# Patient Record
Sex: Male | Born: 1940 | Race: White | Hispanic: No | Marital: Married | Smoking: Former smoker
Health system: Southern US, Community
[De-identification: ages and names within clinical notes are randomized; demographics above are authoritative.]

## PROBLEM LIST (undated history)

## (undated) DIAGNOSIS — I2510 Atherosclerotic heart disease of native coronary artery without angina pectoris: Secondary | ICD-10-CM

## (undated) DIAGNOSIS — I10 Essential (primary) hypertension: Secondary | ICD-10-CM

## (undated) DIAGNOSIS — I119 Hypertensive heart disease without heart failure: Secondary | ICD-10-CM

## (undated) DIAGNOSIS — E785 Hyperlipidemia, unspecified: Secondary | ICD-10-CM

## (undated) HISTORY — DX: Atherosclerotic heart disease of native coronary artery without angina pectoris: I25.10

## (undated) HISTORY — DX: Essential (primary) hypertension: I10

## (undated) HISTORY — DX: Hypertensive heart disease without heart failure: I11.9

## (undated) HISTORY — PX: INGUINAL HERNIA REPAIR: SUR1180

## (undated) HISTORY — PX: SKIN CANCER EXCISION: SHX779

## (undated) HISTORY — DX: Hyperlipidemia, unspecified: E78.5

## (undated) HISTORY — PX: CORONARY ARTERY BYPASS GRAFT: SHX141

## (undated) HISTORY — PX: HYDROCELE EXCISION: SHX482

## (undated) HISTORY — PX: TOTAL SHOULDER REPLACEMENT: SUR1217

## (undated) HISTORY — PX: KNEE SURGERY: SHX244

---

## 2004-01-29 ENCOUNTER — Ambulatory Visit: Payer: Self-pay | Admitting: Family Medicine

## 2004-07-21 ENCOUNTER — Ambulatory Visit: Payer: Self-pay | Admitting: Family Medicine

## 2004-12-05 ENCOUNTER — Ambulatory Visit: Payer: Self-pay | Admitting: Family Medicine

## 2004-12-19 ENCOUNTER — Ambulatory Visit (HOSPITAL_COMMUNITY): Admission: RE | Admit: 2004-12-19 | Discharge: 2004-12-19 | Payer: Self-pay | Admitting: Cardiology

## 2004-12-19 ENCOUNTER — Ambulatory Visit: Payer: Self-pay | Admitting: Cardiology

## 2004-12-26 ENCOUNTER — Ambulatory Visit: Payer: Self-pay | Admitting: Family Medicine

## 2004-12-27 ENCOUNTER — Emergency Department (HOSPITAL_COMMUNITY): Admission: EM | Admit: 2004-12-27 | Discharge: 2004-12-27 | Payer: Self-pay | Admitting: Emergency Medicine

## 2004-12-30 ENCOUNTER — Inpatient Hospital Stay (HOSPITAL_COMMUNITY): Admission: RE | Admit: 2004-12-30 | Discharge: 2005-01-04 | Payer: Self-pay | Admitting: Cardiothoracic Surgery

## 2005-01-14 ENCOUNTER — Emergency Department (HOSPITAL_COMMUNITY): Admission: EM | Admit: 2005-01-14 | Discharge: 2005-01-14 | Payer: Self-pay | Admitting: Emergency Medicine

## 2005-02-03 ENCOUNTER — Ambulatory Visit: Payer: Self-pay | Admitting: Family Medicine

## 2006-11-03 IMAGING — CR DG CHEST 1V PORT
1 series · 1 of 1 positions shown · non-contrast
Comparison: none

CLINICAL DATA: 64 year old male; coronary artery disease, status post CABG.
PORTABLE AP CHEST - 1 VIEW - 12/30/04:

[view not recorded]
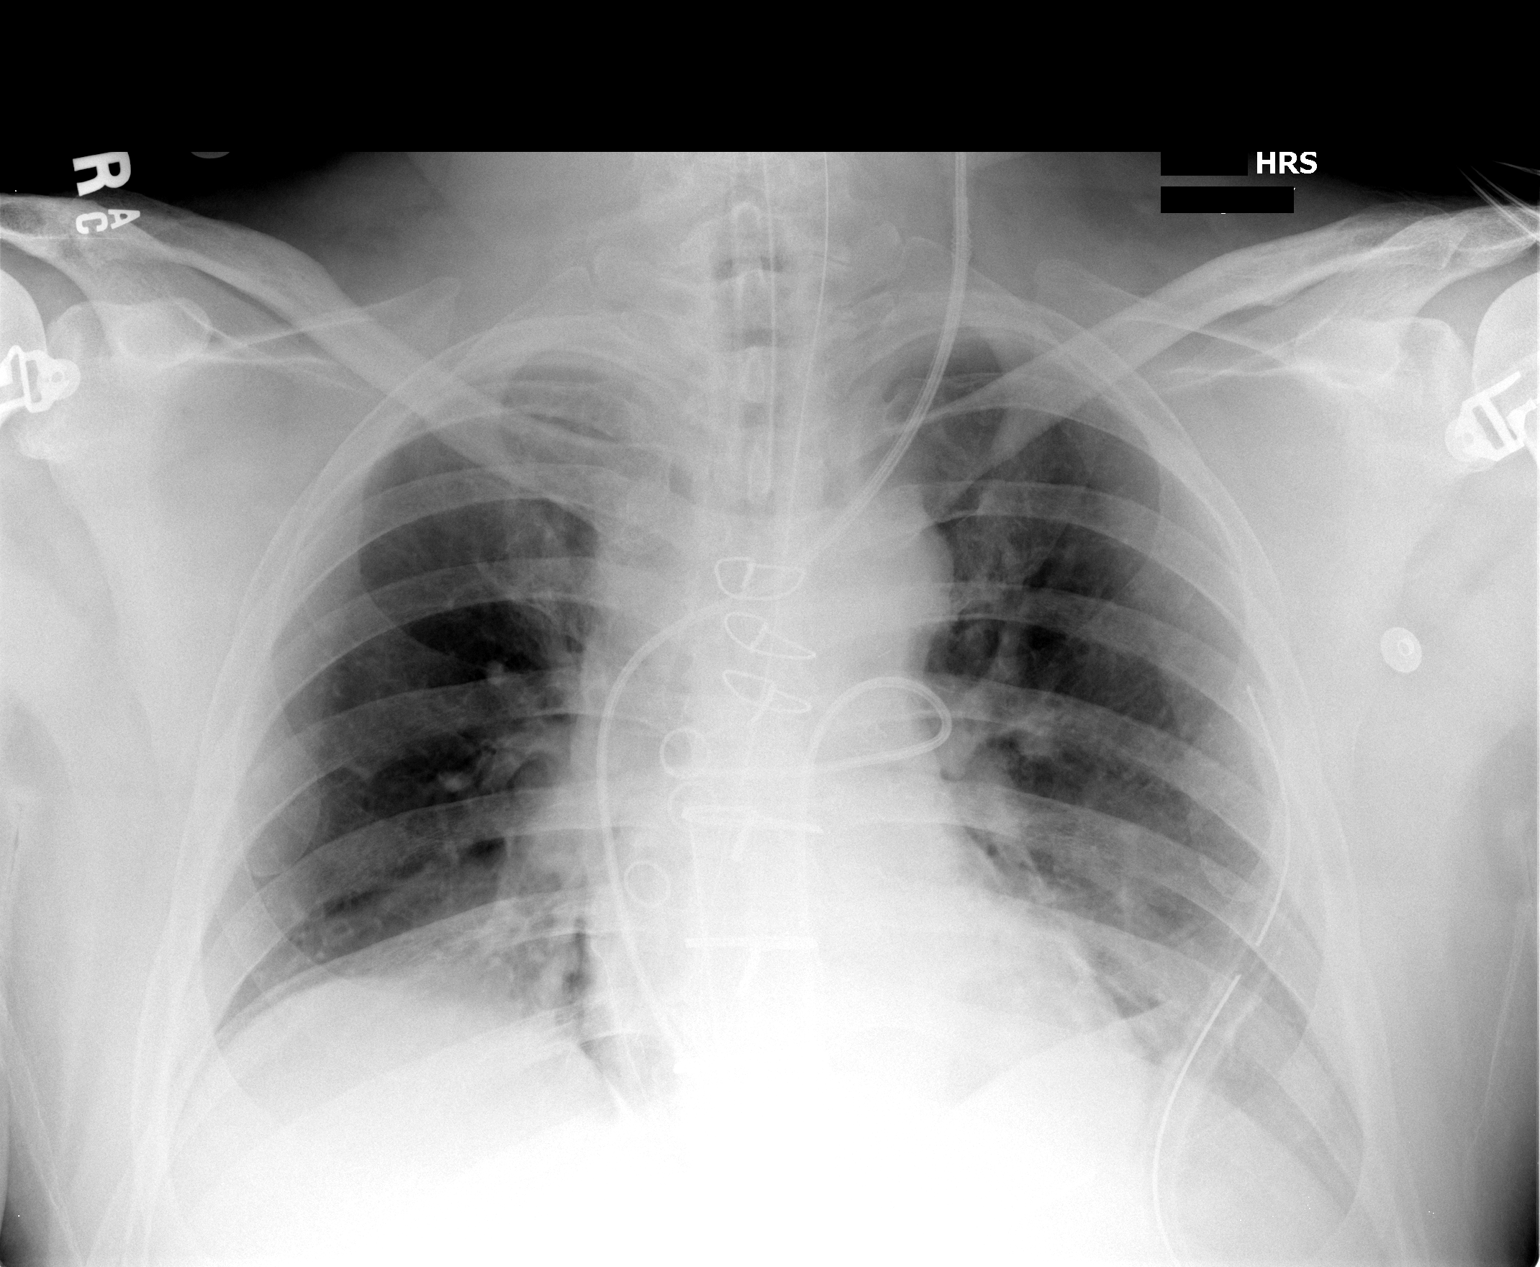

[1 of 1 positions shown; findings below may reference images not displayed]

FINDINGS: The endotracheal tube terminates at the level of the clavicles, well above the carina.  The patient is status post median sternotomy for CABG.  Three venous graft markers are in place.  A nasogastric tube courses of the inferior border of the film.  A Swan-Ganz catheter enters via the left IJ sheath with the tip in the proximal right pulmonary artery.  There is a loop in catheter within pulmonary outflow tract potentially extending into the left pulmonary artery.
Mediastinal drains and a left-sided chest tube are in place.  There is no pneumothorax.  Lung volumes are low with bibasilar atelectasis.  A small left effusion is noted.
IMPRESSION: 1.  Status post CABG.
2.  Support apparatus as above.  There is a loop in the Swan-Ganz catheter within the pulmonary outflow tract which is directed into the left pulmonary artery.  The tip is directed towards the right pulmonary artery.
3.  Small left pleural effusion and bibasilar atelectasis.

## 2006-11-04 IMAGING — CR DG CHEST 1V PORT
1 series · 1 of 1 positions shown · non-contrast
Comparison: 12/30/04.

CLINICAL DATA: CABG.

[view not recorded]
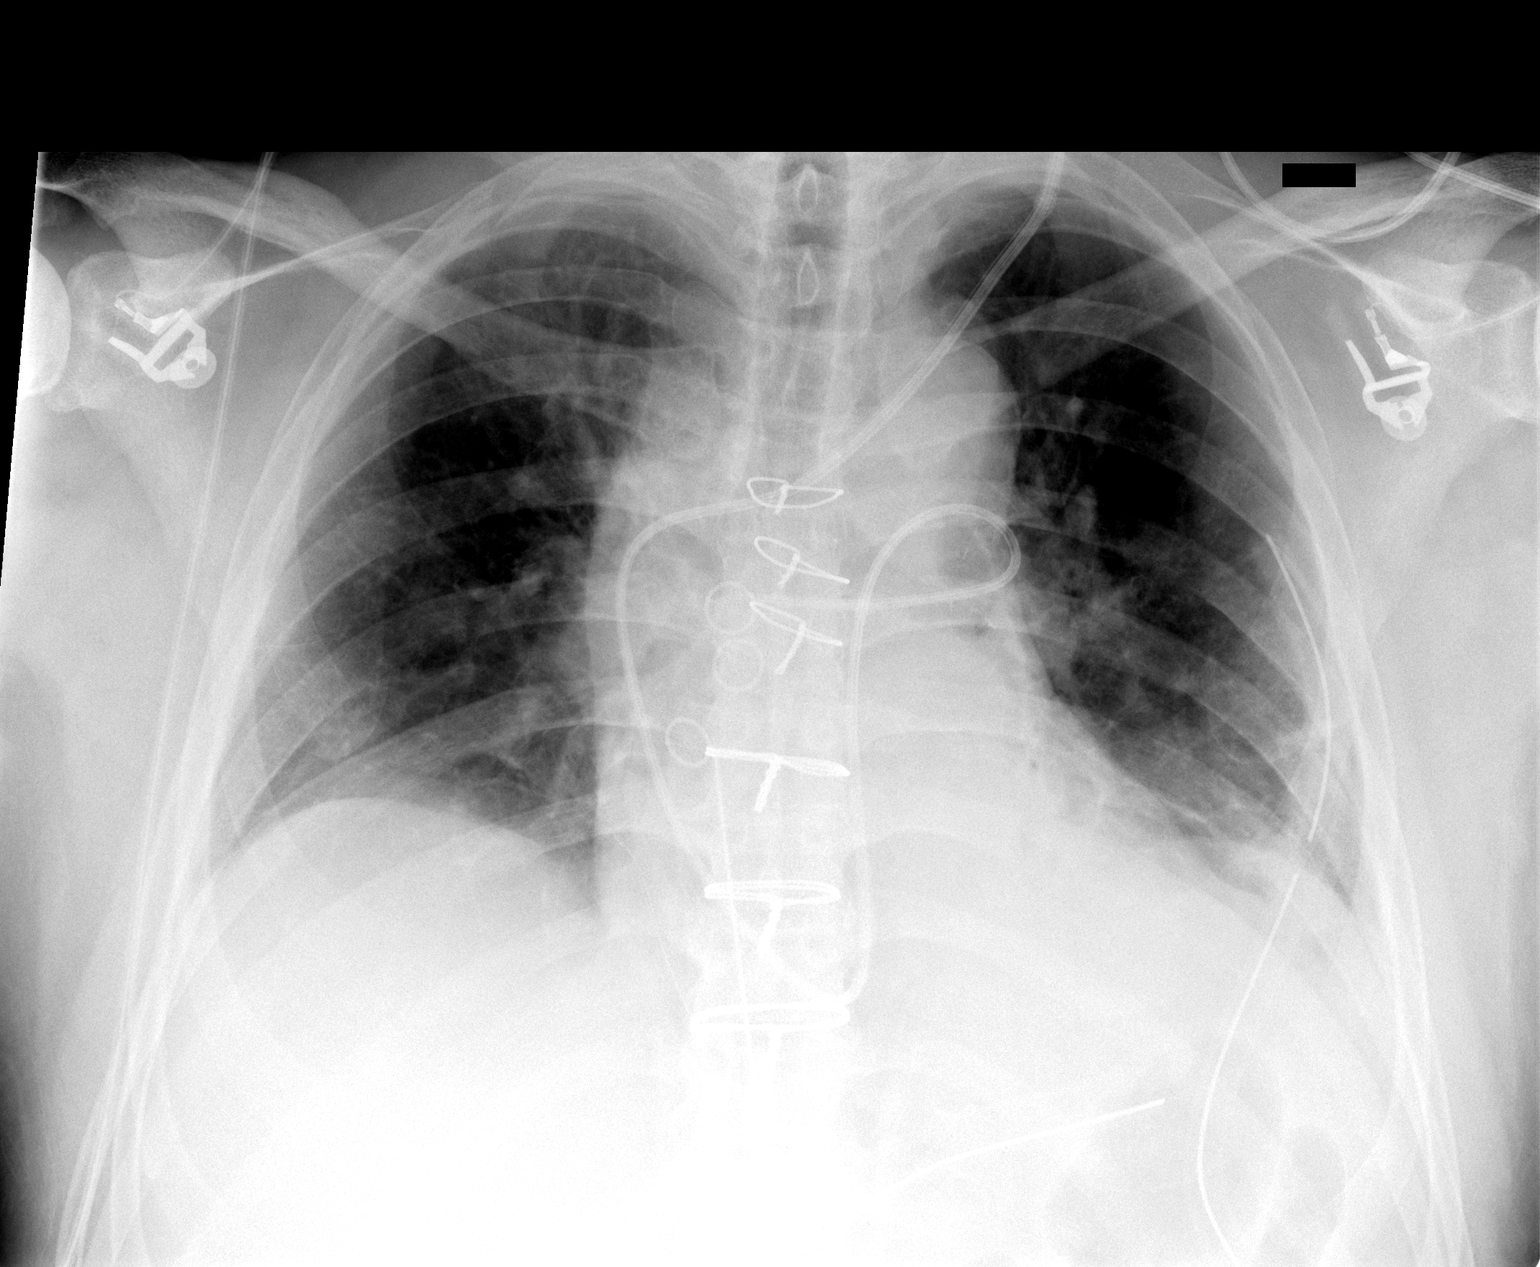

[1 of 1 positions shown; findings below may reference images not displayed]

PORTABLE CHEST - 1 VIEW:
 AP film at 5102 hours shows interval extubation and removal of the NG tube.  Left chest tube remains in place without evidence for pneumothorax.  Cardiopericardial silhouette is stable with slight interval increase in left base atelectasis.  Pulmonary artery catheter remains looped in the left main pulmonary artery, with the tip directed contralaterally into the right main pulmonary artery, stable.  Two mediastinal/pericardial drains remain in place.
IMPRESSION: 1.  Interval extubation.
 2.  Lower volumes with increased atelectasis at the left base.  
 3.  Pulmonary artery catheter remains looped in the left main pulmonary artery.

## 2006-11-05 IMAGING — CR DG CHEST 1V PORT
1 series · 1 of 1 positions shown · non-contrast
Comparison: 12/31/2004.

CLINICAL DATA: CABG.
 PORTABLE CHEST - 1 VIEW:

[view not recorded]
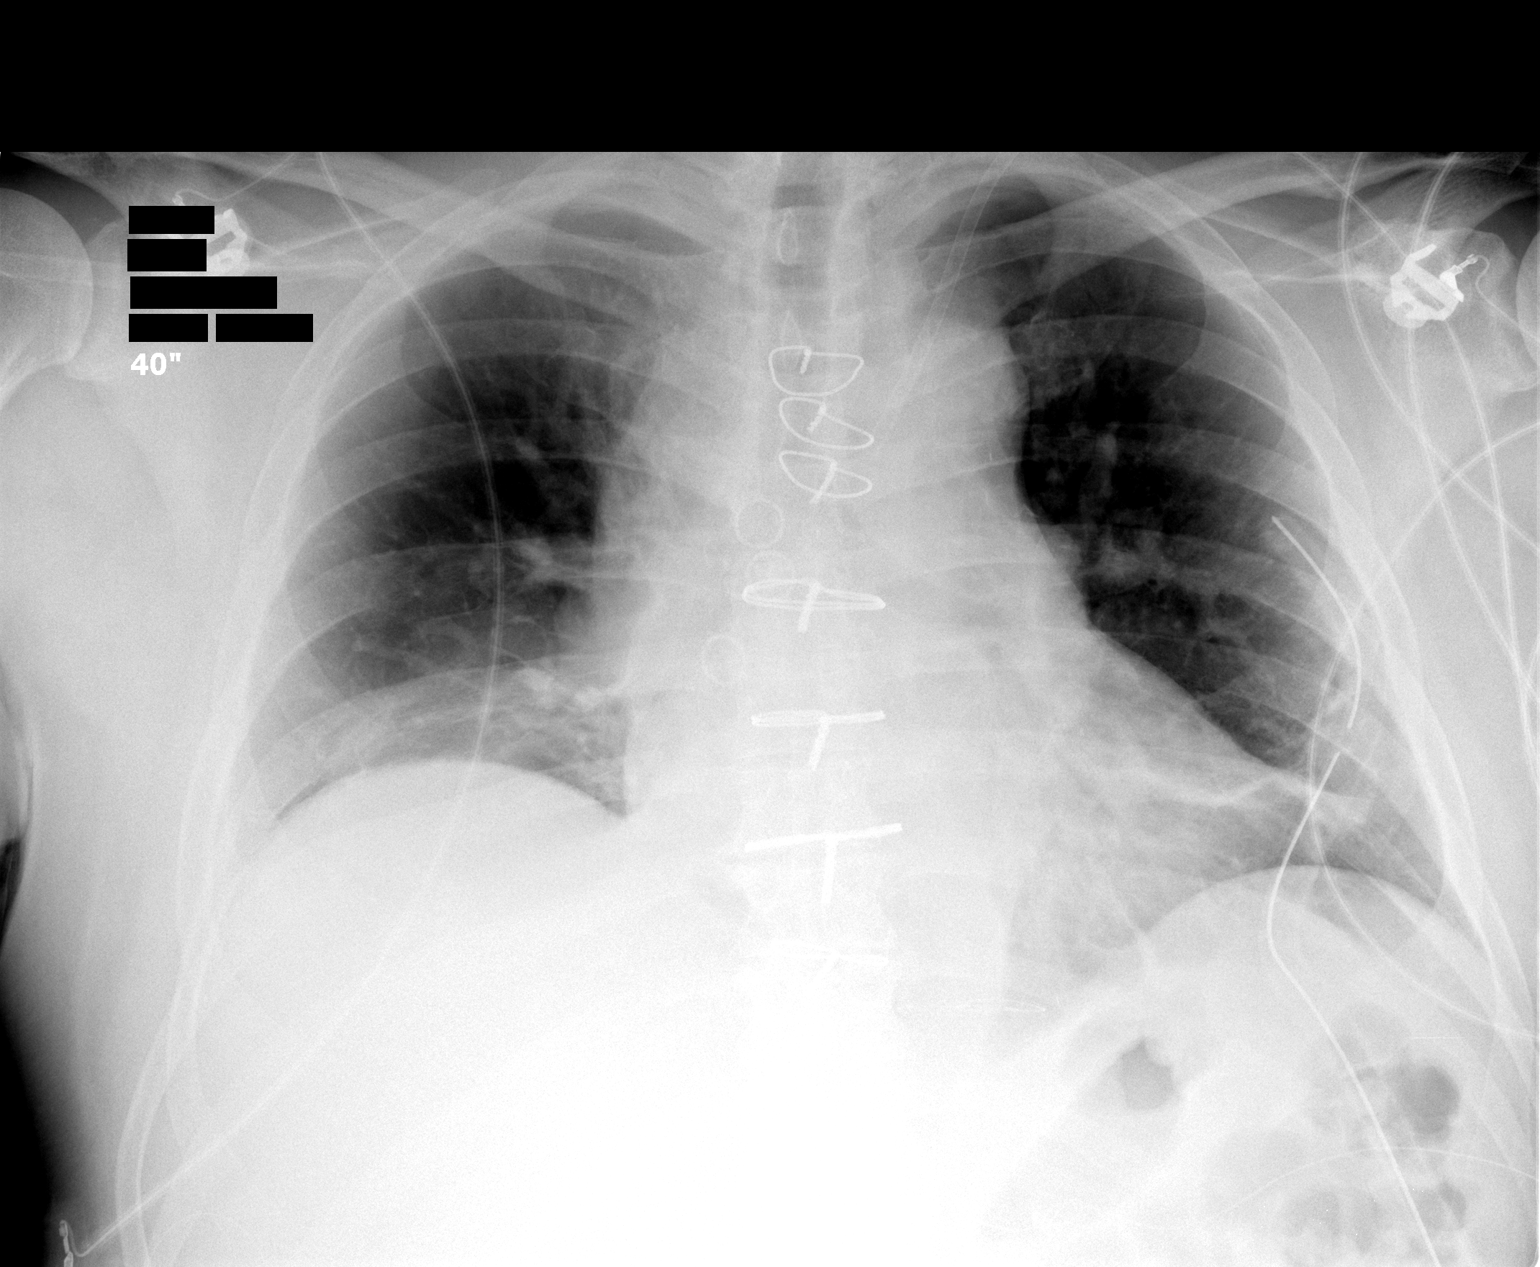

[1 of 1 positions shown; findings below may reference images not displayed]

AP film at 7807 hours shows interval removal of the pulmonary artery catheter and the two mediastinal/pericardial drains.  The left chest tube remains in place with a tiny residual pneumothorax.  The left IJ sheath remains in place.  Bibasilar atelectasis noted.
IMPRESSION: 1.  Low volume film with bibasilar atelectasis.
 2.   Left chest tube persists with a tiny residual pneumothorax.

## 2006-11-18 IMAGING — CR DG CHEST 2V
2 series · 2 of 2 positions shown · non-contrast
Comparison: 01/02/05.

CLINICAL DATA: CABG two weeks ago.  Now with chest pain. 
 CHEST ? 2 VIEW:

[w chest pa]
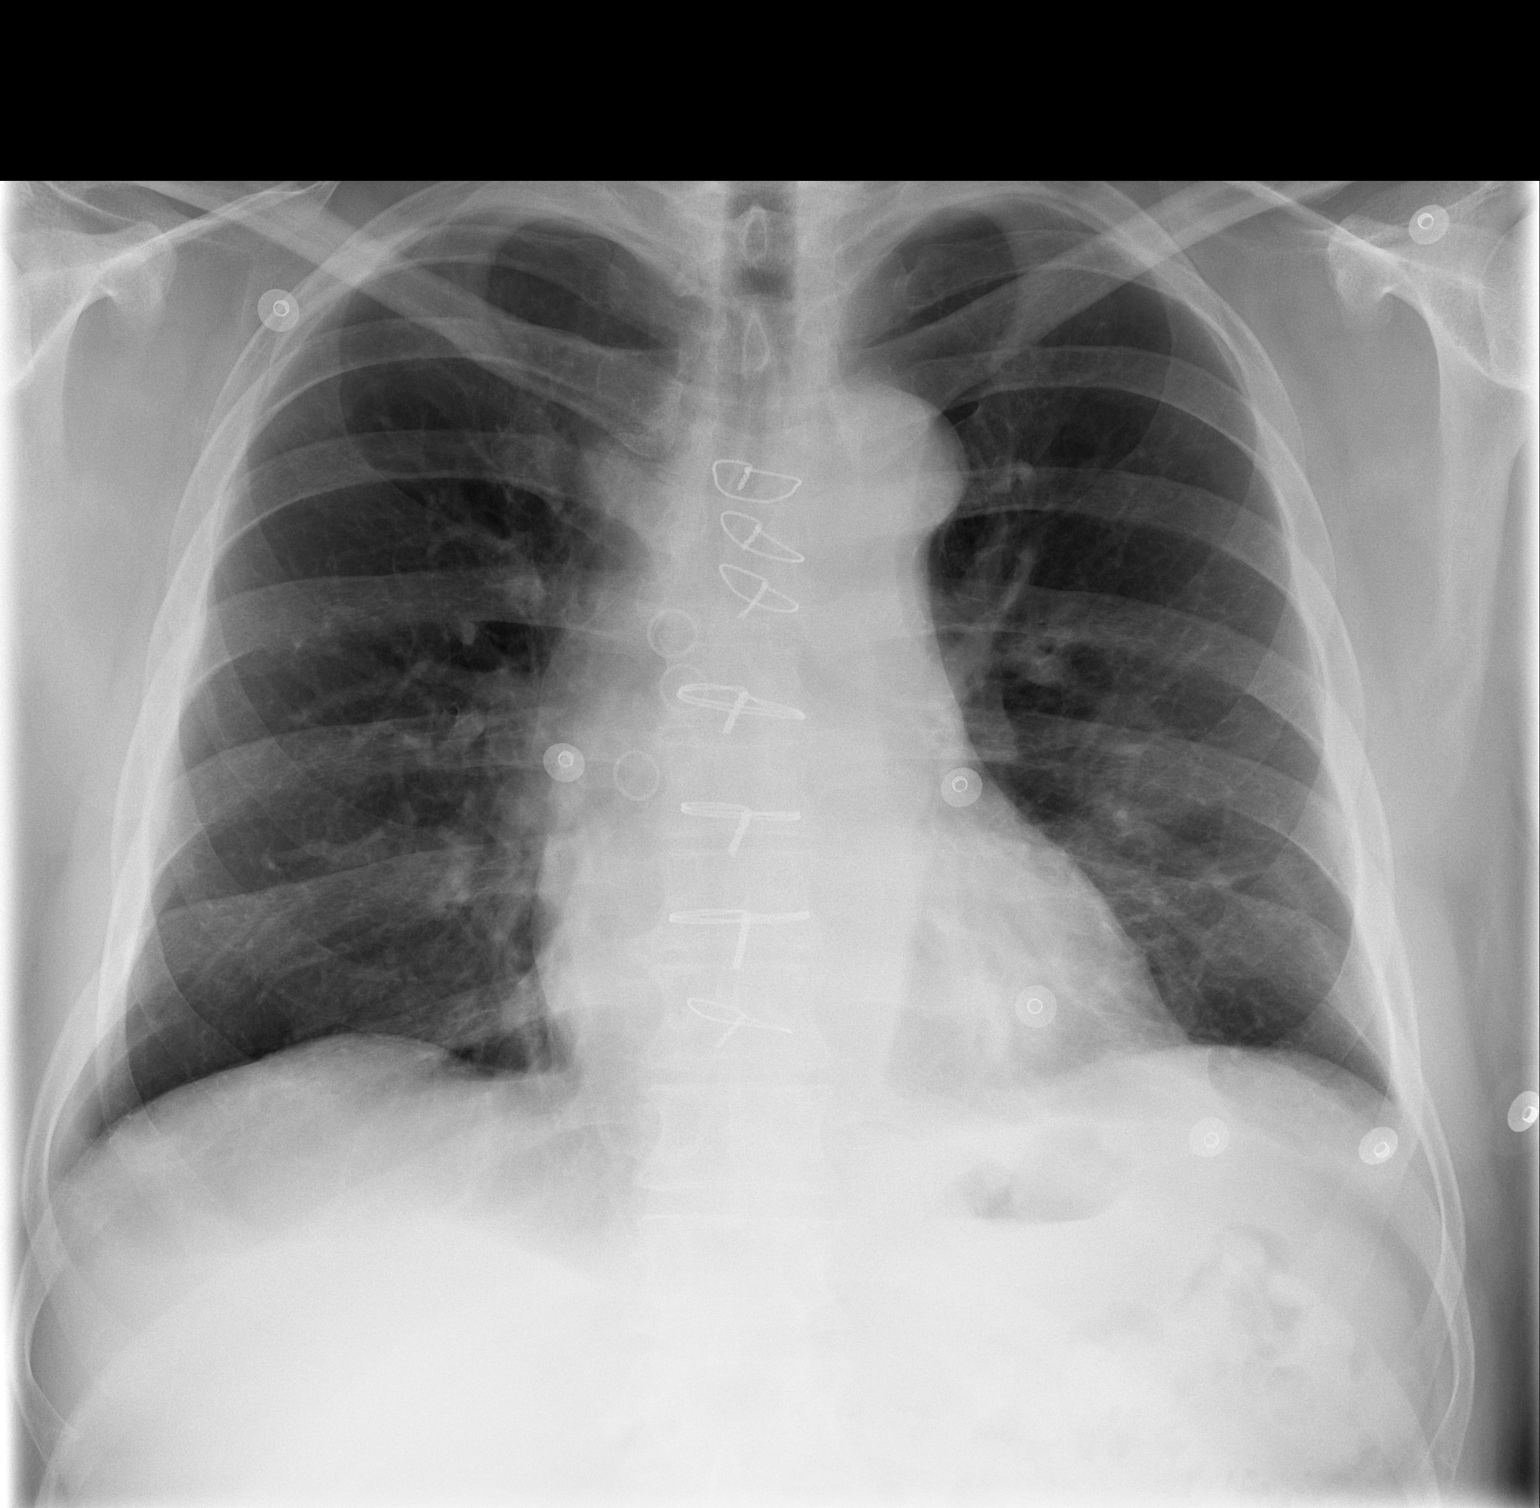

[w chest lat]
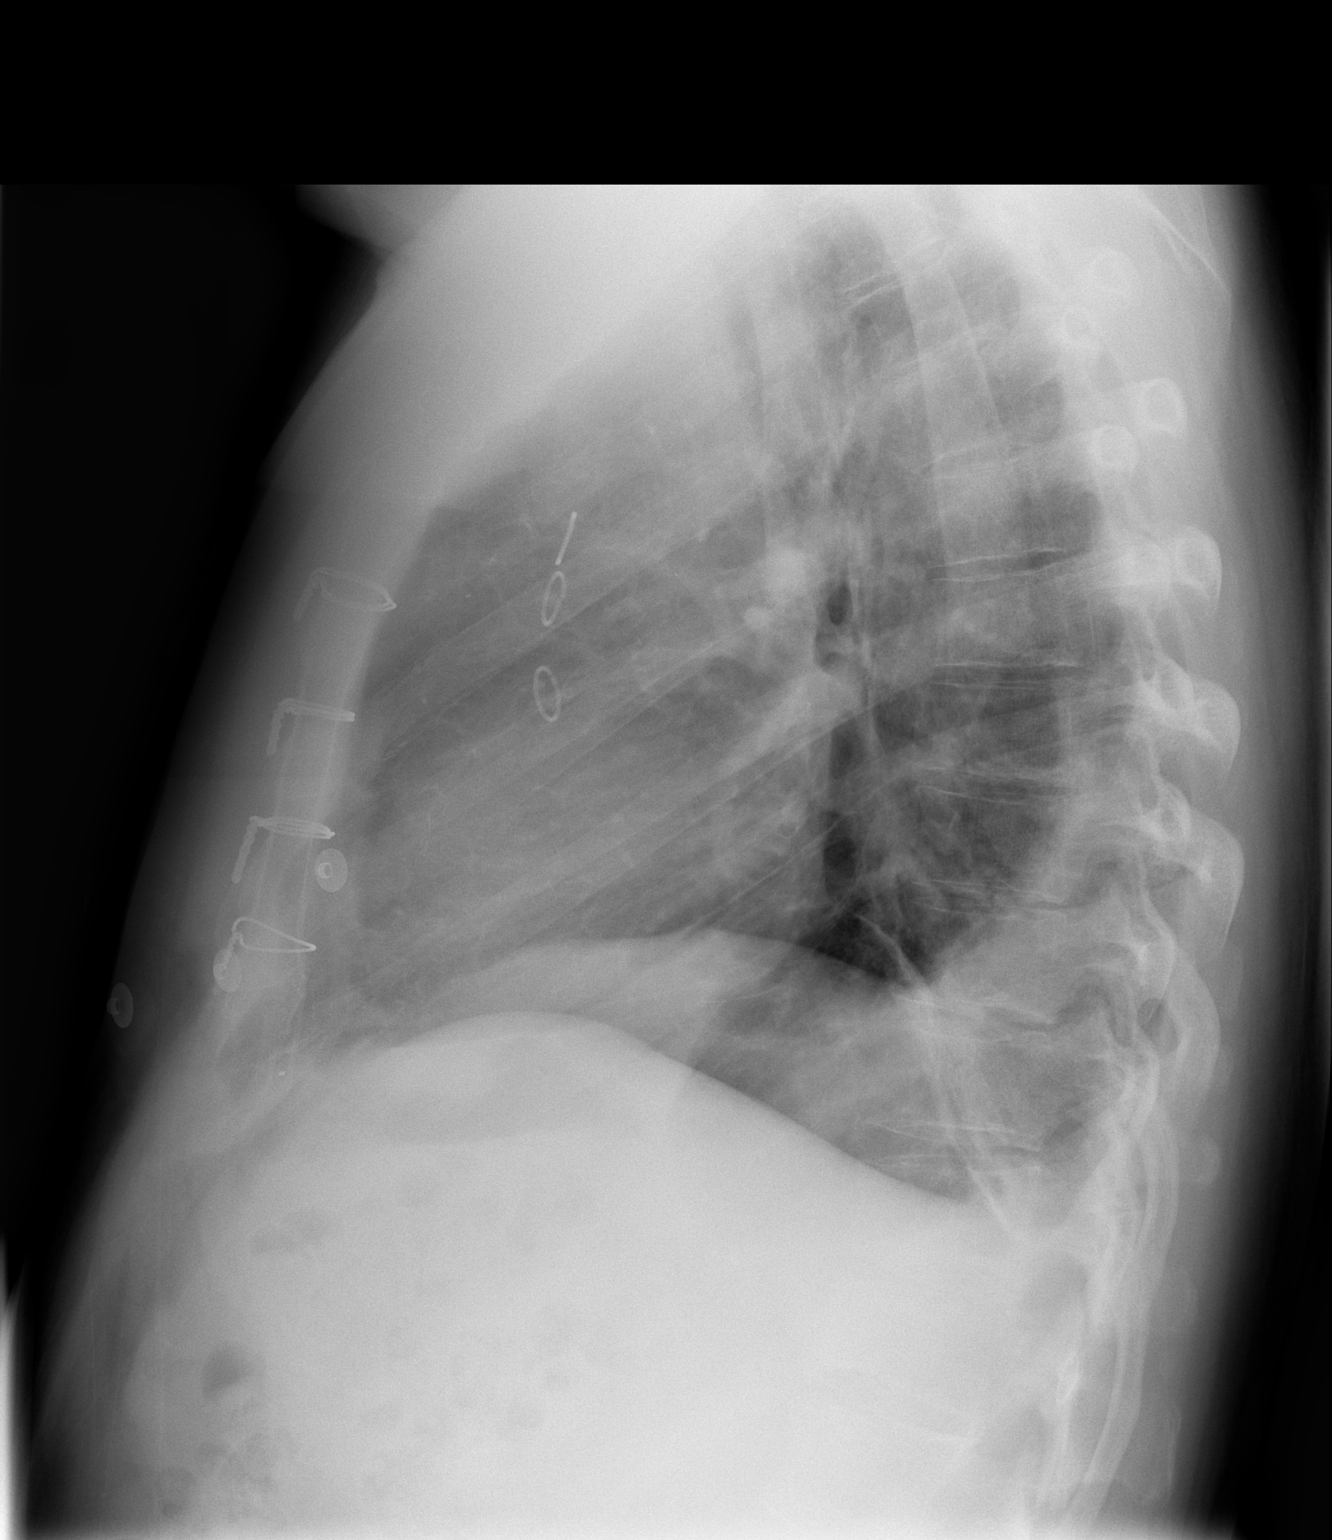

[2 of 2 positions shown; findings below may reference images not displayed]

FINDINGS: Heart size is normal.  There are no effusions or edema.  
 Left lung base atelectasis has improved in the interval. 
 There is no change in the small right effusion.
IMPRESSION: Improving left lung base atelectasis.

## 2006-11-18 IMAGING — CT CT ANGIO CHEST
2 of 6 series · 17 of 36 positions shown · IV contrast (omnipaque)
Comparison: None available.

CLINICAL DATA: Chest pain.  Two weeks post CABG. 
 CT ANGIOGRAPHY OF CHEST:
TECHNIQUE: Multidetector CT imaging of the chest was performed during bolus injection of intravenous contrast.  Multiplanar CT angiographic image reconstructions were generated to evaluate the vascular anatomy.
 Contrast:  100 cc Omnipaque 300

[Series 2: pe · axial · 0.78mm/px · z∈[-330,-72]mm · 14 of 478 slices shown]
[im 32/478  lung]
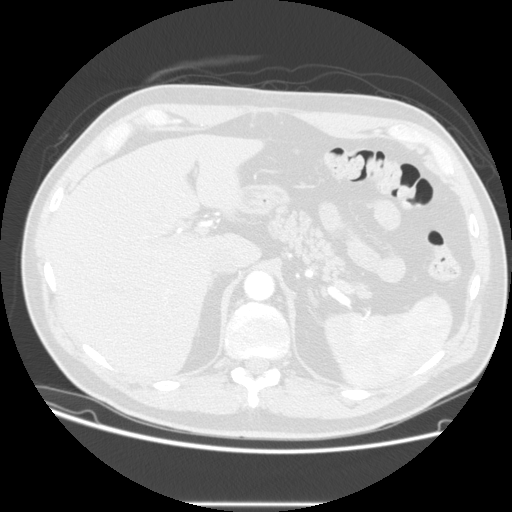
[im 64/478  mediastinal]
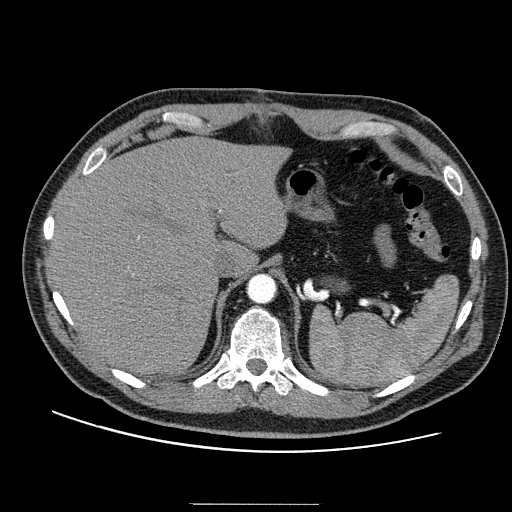
[im 96/478  lung]
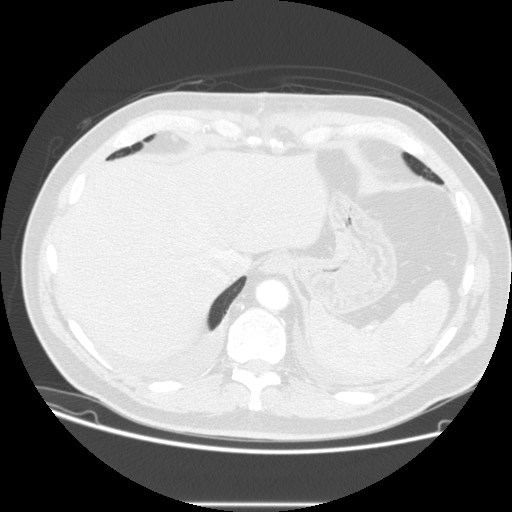
[im 128/478  mediastinal]
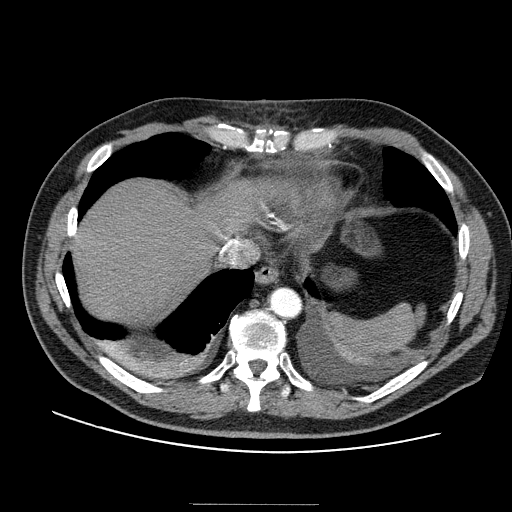
[im 160/478  lung]
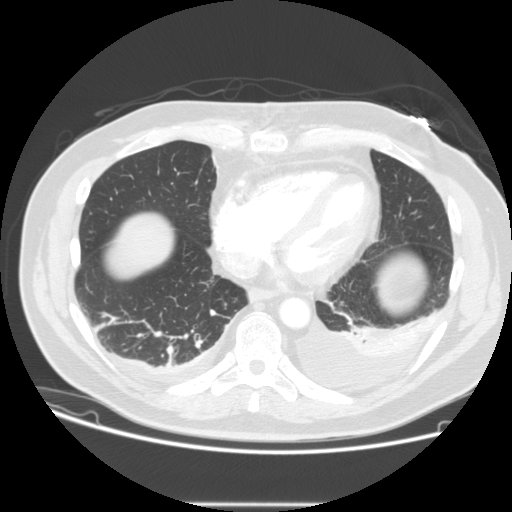
[im 191/478  mediastinal]
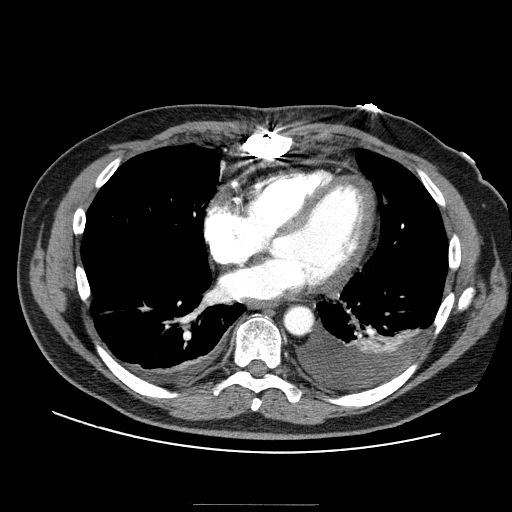
[im 223/478  lung]
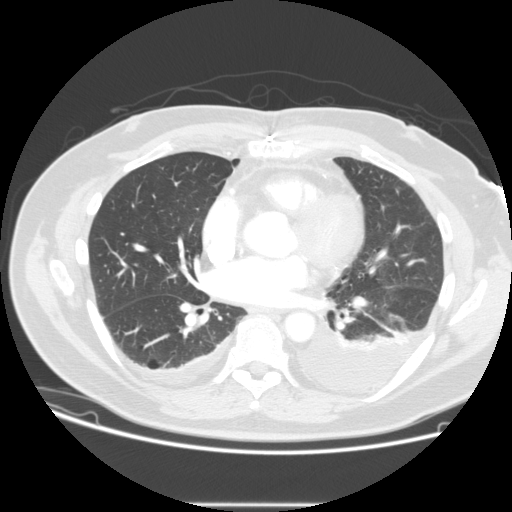
[im 255/478  mediastinal]
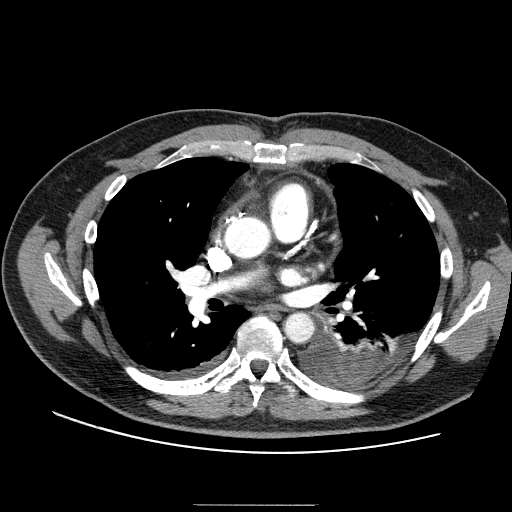
[im 287/478  lung]
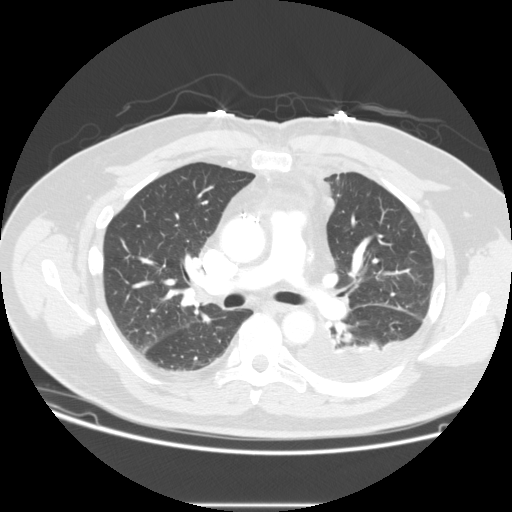
[im 319/478  mediastinal]
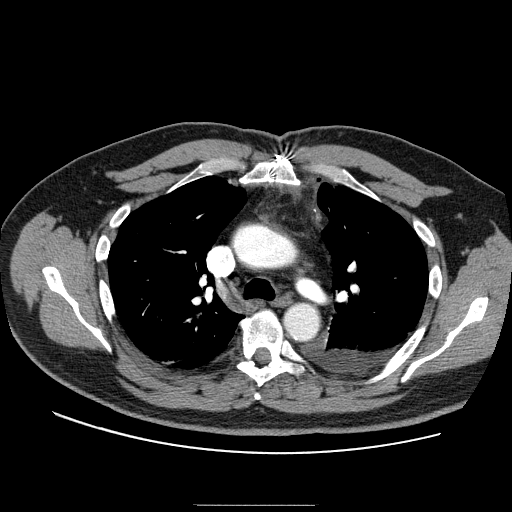
[im 350/478  lung]
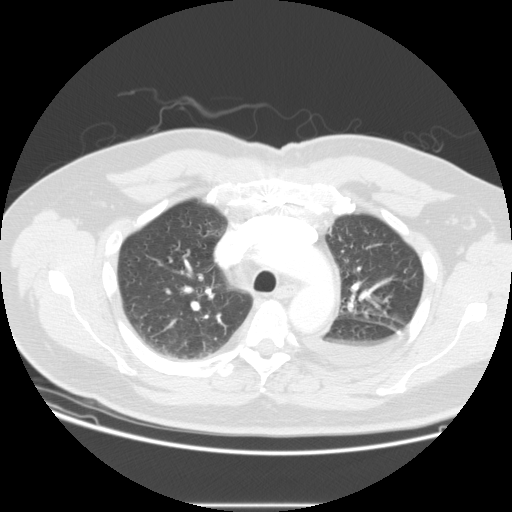
[im 382/478  mediastinal]
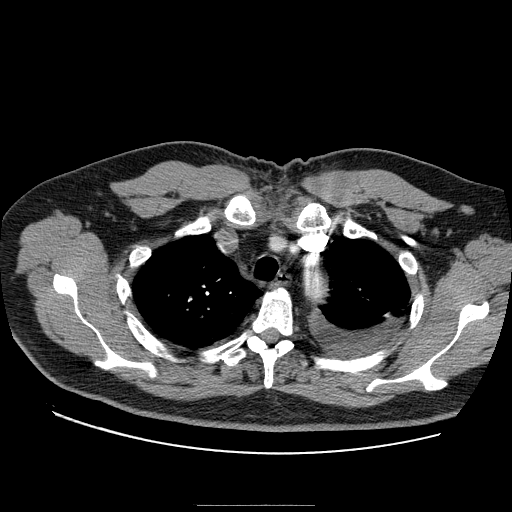
[im 414/478  lung]
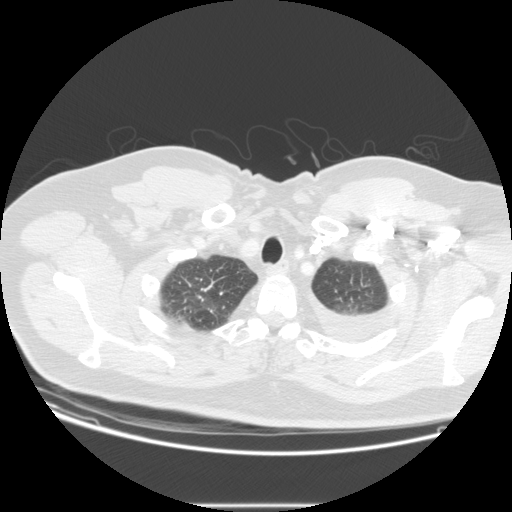
[im 446/478  mediastinal]
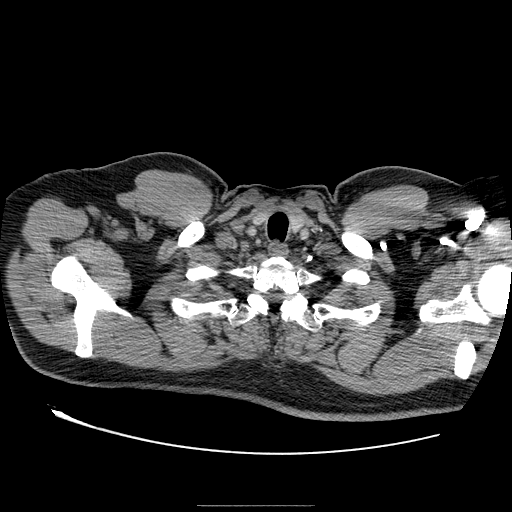

[Series 202: reformatted · coronal · 0.59mm/px · 3 of 83 slices shown]
[im 17/83  mediastinal]
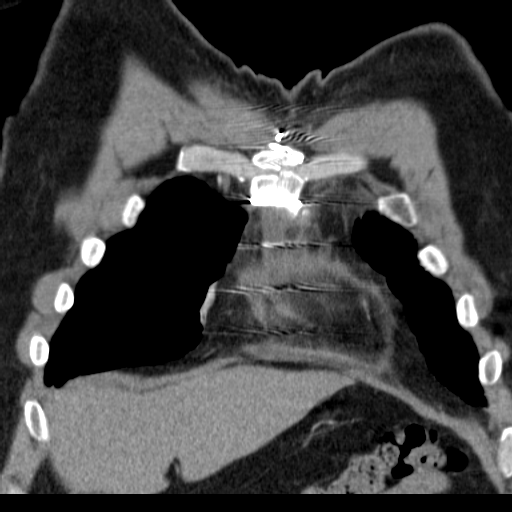
[im 33/83  mediastinal]
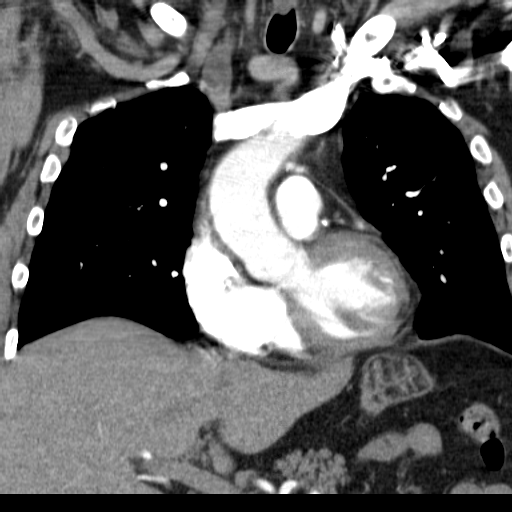
[im 50/83  mediastinal]
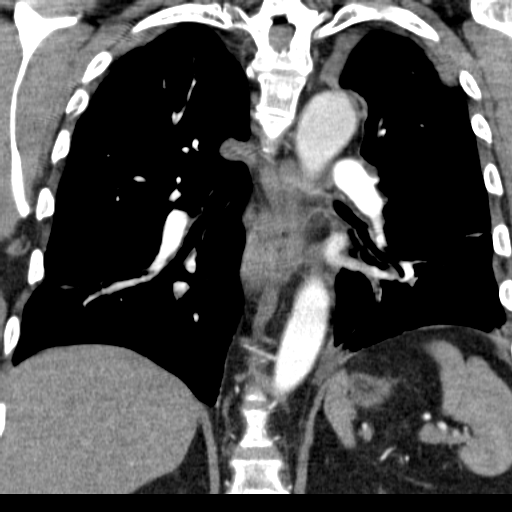

[17 of 36 positions shown; findings below may reference images not displayed]

FINDINGS: In three planes, no definite pulmonary emboli are identified.  There are bilateral pleural effusions, small on the right and small to moderate on the left.  There is left lower lobe atelectasis.
 No major lung infiltrates otherwise.  No pericardial fluid. 
 No evidence of aortic dissection or aneurysm.
IMPRESSION: 1.  No pulmonary emboli identified. 
 2.  No dissection or aortic aneurysm. 
 3.  There are bilateral pleural effusions ? left greater than right, with left lower lobe atelectasis.  
 4.  No pericardial fluid.

## 2010-02-22 ENCOUNTER — Encounter: Payer: Self-pay | Admitting: Cardiothoracic Surgery

## 2015-03-15 DIAGNOSIS — I10 Essential (primary) hypertension: Secondary | ICD-10-CM | POA: Insufficient documentation

## 2015-03-15 HISTORY — DX: Essential (primary) hypertension: I10

## 2015-05-20 DIAGNOSIS — G453 Amaurosis fugax: Secondary | ICD-10-CM

## 2015-05-20 DIAGNOSIS — M109 Gout, unspecified: Secondary | ICD-10-CM | POA: Insufficient documentation

## 2015-05-20 DIAGNOSIS — I2510 Atherosclerotic heart disease of native coronary artery without angina pectoris: Secondary | ICD-10-CM | POA: Insufficient documentation

## 2015-05-20 DIAGNOSIS — E782 Mixed hyperlipidemia: Secondary | ICD-10-CM | POA: Insufficient documentation

## 2015-05-20 DIAGNOSIS — K922 Gastrointestinal hemorrhage, unspecified: Secondary | ICD-10-CM | POA: Insufficient documentation

## 2015-05-20 DIAGNOSIS — N401 Enlarged prostate with lower urinary tract symptoms: Secondary | ICD-10-CM | POA: Insufficient documentation

## 2015-05-20 DIAGNOSIS — Z87448 Personal history of other diseases of urinary system: Secondary | ICD-10-CM

## 2015-05-20 HISTORY — DX: Benign prostatic hyperplasia with lower urinary tract symptoms: N40.1

## 2015-05-20 HISTORY — DX: Gout, unspecified: M10.9

## 2015-05-20 HISTORY — DX: Personal history of other diseases of urinary system: Z87.448

## 2015-05-20 HISTORY — DX: Atherosclerotic heart disease of native coronary artery without angina pectoris: I25.10

## 2015-05-20 HISTORY — DX: Mixed hyperlipidemia: E78.2

## 2015-05-20 HISTORY — DX: Amaurosis fugax: G45.3

## 2015-05-20 HISTORY — DX: Gastrointestinal hemorrhage, unspecified: K92.2

## 2015-05-21 DIAGNOSIS — E119 Type 2 diabetes mellitus without complications: Secondary | ICD-10-CM

## 2015-05-21 DIAGNOSIS — R7303 Prediabetes: Secondary | ICD-10-CM | POA: Insufficient documentation

## 2015-05-21 HISTORY — DX: Type 2 diabetes mellitus without complications: E11.9

## 2015-05-21 HISTORY — DX: Prediabetes: R73.03

## 2015-12-04 DIAGNOSIS — L209 Atopic dermatitis, unspecified: Secondary | ICD-10-CM | POA: Insufficient documentation

## 2015-12-04 DIAGNOSIS — H903 Sensorineural hearing loss, bilateral: Secondary | ICD-10-CM

## 2015-12-04 DIAGNOSIS — J342 Deviated nasal septum: Secondary | ICD-10-CM | POA: Insufficient documentation

## 2015-12-04 DIAGNOSIS — J309 Allergic rhinitis, unspecified: Secondary | ICD-10-CM | POA: Insufficient documentation

## 2015-12-04 HISTORY — DX: Deviated nasal septum: J34.2

## 2015-12-04 HISTORY — DX: Sensorineural hearing loss, bilateral: H90.3

## 2015-12-04 HISTORY — DX: Allergic rhinitis, unspecified: J30.9

## 2015-12-04 HISTORY — DX: Atopic dermatitis, unspecified: L20.9

## 2016-02-21 DIAGNOSIS — Z125 Encounter for screening for malignant neoplasm of prostate: Secondary | ICD-10-CM | POA: Insufficient documentation

## 2016-02-21 DIAGNOSIS — N529 Male erectile dysfunction, unspecified: Secondary | ICD-10-CM | POA: Insufficient documentation

## 2016-02-21 DIAGNOSIS — M25569 Pain in unspecified knee: Secondary | ICD-10-CM | POA: Insufficient documentation

## 2016-02-21 DIAGNOSIS — Z87438 Personal history of other diseases of male genital organs: Secondary | ICD-10-CM | POA: Insufficient documentation

## 2016-02-21 HISTORY — DX: Personal history of other diseases of male genital organs: Z87.438

## 2016-02-21 HISTORY — DX: Encounter for screening for malignant neoplasm of prostate: Z12.5

## 2016-02-21 HISTORY — DX: Male erectile dysfunction, unspecified: N52.9

## 2016-02-21 HISTORY — DX: Pain in unspecified knee: M25.569

## 2016-02-23 DIAGNOSIS — N62 Hypertrophy of breast: Secondary | ICD-10-CM | POA: Insufficient documentation

## 2016-02-23 HISTORY — DX: Hypertrophy of breast: N62

## 2016-06-26 DIAGNOSIS — G479 Sleep disorder, unspecified: Secondary | ICD-10-CM

## 2016-06-26 DIAGNOSIS — R351 Nocturia: Secondary | ICD-10-CM

## 2016-06-26 HISTORY — DX: Sleep disorder, unspecified: G47.9

## 2016-06-26 HISTORY — DX: Nocturia: R35.1

## 2016-08-11 DIAGNOSIS — G4733 Obstructive sleep apnea (adult) (pediatric): Secondary | ICD-10-CM | POA: Insufficient documentation

## 2016-08-11 HISTORY — DX: Obstructive sleep apnea (adult) (pediatric): G47.33

## 2017-04-23 DIAGNOSIS — J042 Acute laryngotracheitis: Secondary | ICD-10-CM

## 2017-04-23 HISTORY — DX: Acute laryngotracheitis: J04.2

## 2017-06-18 DIAGNOSIS — R5383 Other fatigue: Secondary | ICD-10-CM | POA: Insufficient documentation

## 2017-06-18 HISTORY — DX: Other fatigue: R53.83

## 2017-08-24 DIAGNOSIS — S39012A Strain of muscle, fascia and tendon of lower back, initial encounter: Secondary | ICD-10-CM | POA: Insufficient documentation

## 2017-08-24 HISTORY — DX: Strain of muscle, fascia and tendon of lower back, initial encounter: S39.012A

## 2017-11-16 DIAGNOSIS — T148XXA Other injury of unspecified body region, initial encounter: Secondary | ICD-10-CM

## 2017-11-16 HISTORY — DX: Other injury of unspecified body region, initial encounter: T14.8XXA

## 2018-01-14 DIAGNOSIS — G8929 Other chronic pain: Secondary | ICD-10-CM

## 2018-01-14 HISTORY — DX: Other chronic pain: G89.29

## 2018-06-28 DIAGNOSIS — H9313 Tinnitus, bilateral: Secondary | ICD-10-CM | POA: Insufficient documentation

## 2018-06-28 HISTORY — DX: Tinnitus, bilateral: H93.13

## 2018-08-17 DIAGNOSIS — I10 Essential (primary) hypertension: Secondary | ICD-10-CM

## 2018-08-17 DIAGNOSIS — I2510 Atherosclerotic heart disease of native coronary artery without angina pectoris: Secondary | ICD-10-CM

## 2018-08-17 DIAGNOSIS — R079 Chest pain, unspecified: Secondary | ICD-10-CM | POA: Diagnosis not present

## 2018-08-17 DIAGNOSIS — E7800 Pure hypercholesterolemia, unspecified: Secondary | ICD-10-CM | POA: Diagnosis not present

## 2018-08-17 DIAGNOSIS — R0789 Other chest pain: Secondary | ICD-10-CM | POA: Diagnosis not present

## 2018-08-18 DIAGNOSIS — E7800 Pure hypercholesterolemia, unspecified: Secondary | ICD-10-CM | POA: Diagnosis not present

## 2018-08-18 DIAGNOSIS — R0789 Other chest pain: Secondary | ICD-10-CM | POA: Diagnosis not present

## 2018-08-18 DIAGNOSIS — I10 Essential (primary) hypertension: Secondary | ICD-10-CM | POA: Diagnosis not present

## 2018-08-18 DIAGNOSIS — I2510 Atherosclerotic heart disease of native coronary artery without angina pectoris: Secondary | ICD-10-CM | POA: Diagnosis not present

## 2018-08-18 DIAGNOSIS — R079 Chest pain, unspecified: Secondary | ICD-10-CM

## 2018-09-01 DIAGNOSIS — Z09 Encounter for follow-up examination after completed treatment for conditions other than malignant neoplasm: Secondary | ICD-10-CM | POA: Insufficient documentation

## 2018-09-01 HISTORY — DX: Encounter for follow-up examination after completed treatment for conditions other than malignant neoplasm: Z09

## 2019-02-01 ENCOUNTER — Telehealth: Payer: Self-pay | Admitting: Cardiology

## 2019-02-01 NOTE — Telephone Encounter (Signed)
Called patient regarding pre-op risk appt. Do not see where he has seen any of our cardiologists so can be assigned to anyone for consult. LAM to return call

## 2019-02-02 ENCOUNTER — Encounter: Payer: Self-pay | Admitting: Cardiology

## 2019-02-02 ENCOUNTER — Ambulatory Visit (INDEPENDENT_AMBULATORY_CARE_PROVIDER_SITE_OTHER): Payer: Medicare Other | Admitting: Cardiology

## 2019-02-02 ENCOUNTER — Other Ambulatory Visit: Payer: Self-pay

## 2019-02-02 VITALS — BP 114/62 | HR 63 | Ht 71.0 in | Wt 227.6 lb

## 2019-02-02 DIAGNOSIS — I10 Essential (primary) hypertension: Secondary | ICD-10-CM | POA: Diagnosis not present

## 2019-02-02 DIAGNOSIS — E782 Mixed hyperlipidemia: Secondary | ICD-10-CM

## 2019-02-02 DIAGNOSIS — Z01818 Encounter for other preprocedural examination: Secondary | ICD-10-CM | POA: Insufficient documentation

## 2019-02-02 DIAGNOSIS — I2510 Atherosclerotic heart disease of native coronary artery without angina pectoris: Secondary | ICD-10-CM

## 2019-02-02 HISTORY — DX: Encounter for other preprocedural examination: Z01.818

## 2019-02-02 HISTORY — DX: Mixed hyperlipidemia: E78.2

## 2019-02-02 NOTE — Patient Instructions (Signed)
Medication Instructions:  Your physician recommends that you continue on your current medications as directed. Please refer to the Current Medication list given to you today.  *If you need a refill on your cardiac medications before your next appointment, please call your pharmacy*  Lab Work: None If you have labs (blood work) drawn today and your tests are completely normal, you will receive your results only by: Marland Kitchen MyChart Message (if you have MyChart) OR . A paper copy in the mail If you have any lab test that is abnormal or we need to change your treatment, we will call you to review the results.  Testing/Procedures: Your physician has requested that you have an echocardiogram. Echocardiography is a painless test that uses sound waves to create images of your heart. It provides your doctor with information about the size and shape of your heart and how well your heart's chambers and valves are working. This procedure takes approximately one hour. There are no restrictions for this procedure.    Follow-Up: At [facility], you and your health needs are our priority.  As part of our continuing mission to provide you with exceptional heart care, we have created designated Provider Care Teams.  These Care Teams include your primary Cardiologist (physician) and Advanced Practice Providers (APPs -  Physician Assistants and Nurse Practitioners) who all work together to provide you with the care you need, when you need it.  Your next appointment:   1 year(s)  The format for your next appointment:   In Person  Provider:   Berniece Salines, DO  Other Instructions

## 2019-02-02 NOTE — Progress Notes (Signed)
Cardiology Office Note:    Date:  02/02/2019   ID:  Billy Gallegos, DOB 25-Feb-1940, MRN [id number]  PCP:  Algis Greenhouse, MD  Cardiologist:  Berniece Salines, DO  Electrophysiologist:  None   Referring MD: Vickey Huger, MD   The patient presents for Pre-operative clearance  History of Present Illness:    Billy Gallegos is a 78 y.o. male with a hx of coronary artery disease status post CABG x5, hypertension, hyperlipidemia presents today to be evaluated for preoperative clearance.  The patient tells me that he is very active and does a lot of outdoor activities without experiencing any chest pain, shortness of breath or lightheadedness.  As a matter fact yesterday he told me that he did a lot of outdoor activities.  His most limiting factor is the fact that he does have knee pain at times.  Of note he was hospitalized at [facility] in July 2020 at which time he underwent a pharmacologic nuclear stress test which was reported normal.  Past Medical History:  Diagnosis Date  . CAD (coronary artery disease)    CABG x5  . HTN (hypertension)   . Hyperlipemia     Past Surgical History:  Procedure Laterality Date  . CORONARY ARTERY BYPASS GRAFT     x 4  . HYDROCELE EXCISION    . INGUINAL HERNIA REPAIR      x2  . SKIN CANCER EXCISION      Current Medications: Current Meds  Medication Sig  . allopurinol (ZYLOPRIM) 100 MG tablet TAKE TWO TABLETS BY MOUTH ONCE DAILY PREVENT GOUT.  Marland Kitchen amLODipine (NORVASC) 10 MG tablet Take 5 mg by mouth daily.  Marland Kitchen aspirin 81 MG EC tablet Take 81 mg by mouth daily.  Marland Kitchen atorvastatin (LIPITOR) 40 MG tablet Take 20 mg by mouth daily.  . colchicine 0.6 MG tablet Take 2 tablets at onset of gout attack and repeat with 1 tablet in 1 hour  . ezetimibe (ZETIA) 10 MG tablet Take 10 mg by mouth daily.  . hydrochlorothiazide (HYDRODIURIL) 12.5 MG tablet TAKE ONE TABLET BY MOUTH IN THE MORNING FOR HIGH BLOOD PRESSURE  . irbesartan (AVAPRO) 300 MG tablet  Take 300 mg by mouth every morning.     Allergies:   Oseltamivir, Penicillin g, Lisinopril, Metoprolol tartrate, and Niacin   Social History   Socioeconomic History  . Marital status: Married    Spouse name: Not on file  . Number of children: Not on file  . Years of education: Not on file  . Highest education level: Not on file  Occupational History  . Not on file  Tobacco Use  . Smoking status: Former Smoker    Types: Cigarettes    Quit date: 1981    Years since quitting: 40.0  . Smokeless tobacco: Never Used  Substance and Sexual Activity  . Alcohol use: Not Currently    Comment: occasionally  . Drug use: Never  . Sexual activity: Not on file  Other Topics Concern  . Not on file  Social History Narrative  . Not on file   Social Determinants of Health   Financial Resource Strain:   . Difficulty of Paying Living Expenses: Not on file  Food Insecurity:   . Worried About Charity fundraiser in the Last Year: Not on file  . Ran Out of Food in the Last Year: Not on file  Transportation Needs:   . Lack of Transportation (Medical): Not on file  . Lack  of Transportation (Non-Medical): Not on file  Physical Activity:   . Days of Exercise per Week: Not on file  . Minutes of Exercise per Session: Not on file  Stress:   . Feeling of Stress : Not on file  Social Connections:   . Frequency of Communication with Friends and Family: Not on file  . Frequency of Social Gatherings with Friends and Family: Not on file  . Attends Religious Services: Not on file  . Active Member of Clubs or Organizations: Not on file  . Attends Archivist Meetings: Not on file  . Marital Status: Not on file     Family History: The patient's family history includes CAD in his mother; Emphysema in his father; Heart attack in his mother; Hypertension in his sister; Prostate cancer in his brother; Skin cancer in his sister.  ROS:   Review of Systems  Constitution: Negative for decreased  appetite, fever and weight gain.  HENT: Negative for congestion, ear discharge, hoarse voice and sore throat.   Eyes: Negative for discharge, redness, vision loss in right eye and visual halos.  Cardiovascular: Negative for chest pain, dyspnea on exertion, leg swelling, orthopnea and palpitations.  Respiratory: Negative for cough, hemoptysis, shortness of breath and snoring.   Endocrine: Negative for heat intolerance and polyphagia.  Hematologic/Lymphatic: Negative for bleeding problem. Does not bruise/bleed easily.  Skin: Negative for flushing, nail changes, rash and suspicious lesions.  Musculoskeletal: Negative for arthritis, joint pain, muscle cramps, myalgias, neck pain and stiffness.  Gastrointestinal: Negative for abdominal pain, bowel incontinence, diarrhea and excessive appetite.  Genitourinary: Negative for decreased libido, genital sores and incomplete emptying.  Neurological: Negative for brief paralysis, focal weakness, headaches and loss of balance.  Psychiatric/Behavioral: Negative for altered mental status, depression and suicidal ideas.  Allergic/Immunologic: Negative for HIV exposure and persistent infections.    EKGs/Labs/Other Studies Reviewed:    The following studies were reviewed today:  EKG:  The ekg ordered today demonstrates sinus rhythm, heart rate 63 bpm, prolonged PR interval, RSR prime, with poor R wave progression in precordial leads which could be suggestive of septal infarction.  Pharmacologic stress test performed in July 2020 reports no reversible ischemia or infarction.  Normal left ventricular wall motion.  Left ventricular ejection fraction 65%.  Recent Labs: No results found for requested labs within last 8760 hours.  Recent Lipid Panel No results found for: CHOL, TRIG, HDL, CHOLHDL, VLDL, LDLCALC, LDLDIRECT  Physical Exam:    VS:  BP 114/62 (BP Location: Right Arm)   Pulse 63   Ht 5\' 11"  (1.803 m)   Wt 227 lb 9.6 oz (103.2 kg)   BMI 31.74  kg/m     Wt Readings from Last 3 Encounters:  02/02/19 227 lb 9.6 oz (103.2 kg)    GEN: Well nourished, well developed in no acute distress HEENT: Normal NECK: No JVD; No carotid bruits LYMPHATICS: No lymphadenopathy CARDIAC: S1S2 noted,RRR, no murmurs, rubs, gallops RESPIRATORY:  Clear to auscultation without rales, wheezing or rhonchi  ABDOMEN: Soft, non-tender, non-distended, +bowel sounds, no guarding. EXTREMITIES: No edema, No cyanosis, no clubbing MUSCULOSKELETAL:  No edema; No deformity  SKIN: Warm and dry NEUROLOGIC:  Alert and oriented x 3, non-focal PSYCHIATRIC:  Normal affect, good insight  ASSESSMENT:    1. Pre-operative clearance   2. Essential hypertension   3. Coronary artery disease involving native coronary artery of native heart without angina pectoris   4. Mixed hyperlipidemia    PLAN:    1.  Coronary artery disease-stable.  No symptoms of angina.  Continue patient on his aspirin 81 mg daily, atorvastatin 40 mg along with his Zetia 10 mg daily.  2.  Hypertension-his blood pressure is well controlled on his current medication regimen.  Will order transthoracic echocardiogram to assess for any diastolic dysfunction and any other valvular abnormalities.  3. The patient does not have any unstable cardiac conditions.  Upon evaluation today, he can achieve 4 METs or greater without anginal symptoms.  According to [facility] and AHA guidelines, he requires no further cardiac workup prior to his noncardiac surgery and should be at acceptable risk.  He is continue close hemodynamic monitoring during his procedure.   The patient is in agreement with the above plan. The patient left the office in stable condition.  The patient will follow up in 12 months or sooner if needed.   Medication Adjustments/Labs and Tests Ordered: Current medicines are reviewed at length with the patient today.  Concerns regarding medicines are outlined above.  Orders Placed This Encounter  Procedures   . EKG 12-Lead  . ECHOCARDIOGRAM COMPLETE   No orders of the defined types were placed in this encounter.   Patient Instructions  Medication Instructions:  Your physician recommends that you continue on your current medications as directed. Please refer to the Current Medication list given to you today.  *If you need a refill on your cardiac medications before your next appointment, please call your pharmacy*  Lab Work: None If you have labs (blood work) drawn today and your tests are completely normal, you will receive your results only by: Marland Kitchen MyChart Message (if you have MyChart) OR . A paper copy in the mail If you have any lab test that is abnormal or we need to change your treatment, we will call you to review the results.  Testing/Procedures: Your physician has requested that you have an echocardiogram. Echocardiography is a painless test that uses sound waves to create images of your heart. It provides your doctor with information about the size and shape of your heart and how well your heart's chambers and valves are working. This procedure takes approximately one hour. There are no restrictions for this procedure.    Follow-Up: At [facility], you and your health needs are our priority.  As part of our continuing mission to provide you with exceptional heart care, we have created designated Provider Care Teams.  These Care Teams include your primary Cardiologist (physician) and Advanced Practice Providers (APPs -  Physician Assistants and Nurse Practitioners) who all work together to provide you with the care you need, when you need it.  Your next appointment:   1 year(s)  The format for your next appointment:   In Person  Provider:   Berniece Salines, DO  Other Instructions      Adopting a Healthy Lifestyle.  Know what a healthy weight is for you (roughly BMI <25) and aim to maintain this   Aim for 7+ servings of fruits and vegetables daily   65-80+ fluid ounces  of water or unsweet tea for healthy kidneys   Limit to max 1 drink of alcohol per day; avoid smoking/tobacco   Limit animal fats in diet for cholesterol and heart health - choose grass fed whenever available   Avoid highly processed foods, and foods high in saturated/trans fats   Aim for low stress - take time to unwind and care for your mental health   Aim for 150 min of moderate intensity exercise  weekly for heart health, and weights twice weekly for bone health   Aim for 7-9 hours of sleep daily   When it comes to diets, agreement about the perfect plan isnt easy to find, even among the experts. Experts at the [employer] developed an idea known as the Healthy Eating Plate. Just imagine a plate divided into logical, healthy portions.   The emphasis is on diet quality:   Load up on vegetables and fruits - one-half of your plate: Aim for color and variety, and remember that potatoes dont count.   Go for whole grains - one-quarter of your plate: Whole wheat, barley, wheat berries, quinoa, oats, brown rice, and foods made with them. If you want pasta, go with whole wheat pasta.   Protein power - one-quarter of your plate: Fish, chicken, beans, and nuts are all healthy, versatile protein sources. Limit red meat.   The diet, however, does go beyond the plate, offering a few other suggestions.   Use healthy plant oils, such as olive, canola, soy, corn, sunflower and peanut. Check the labels, and avoid partially hydrogenated oil, which have unhealthy trans fats.   If youre thirsty, drink water. Coffee and tea are good in moderation, but skip sugary drinks and limit milk and dairy products to one or two daily servings.   The type of carbohydrate in the diet is more important than the amount. Some sources of carbohydrates, such as vegetables, fruits, whole grains, and beans-are healthier than others.   Finally, stay active  Signed, Berniece Salines, DO  02/02/2019  10:07 AM    [place]

## 2019-02-14 DIAGNOSIS — Z9889 Other specified postprocedural states: Secondary | ICD-10-CM | POA: Insufficient documentation

## 2019-02-14 HISTORY — DX: Other specified postprocedural states: Z98.890

## 2019-05-17 ENCOUNTER — Ambulatory Visit (INDEPENDENT_AMBULATORY_CARE_PROVIDER_SITE_OTHER): Payer: Medicare Other

## 2019-05-17 ENCOUNTER — Other Ambulatory Visit: Payer: Self-pay

## 2019-05-17 ENCOUNTER — Telehealth: Payer: Self-pay

## 2019-05-17 DIAGNOSIS — I10 Essential (primary) hypertension: Secondary | ICD-10-CM | POA: Diagnosis not present

## 2019-05-17 NOTE — Progress Notes (Signed)
Complete echocardiogram performed.  Jimmy Tashena Ibach RDCS, RVT  

## 2019-05-17 NOTE — Telephone Encounter (Signed)
Left message on patients voicemail to please return our call.   

## 2019-05-17 NOTE — Telephone Encounter (Signed)
-----   Message from Berniece Salines, DO sent at 05/17/2019  3:31 PM EDT ----- The echo showed that the heart is not fully relaxing like it should ( diastolic dysfunction) ,but otherwise normal. I will discuss it at the next office visit.

## 2019-05-18 NOTE — Telephone Encounter (Signed)
Spoke with patient regarding results and recommendation. He was very unhappy that his call did not come directly to [place] here at the [place] office. I apologized for this inconvenience.   Patient verbalizes understanding and is agreeable to plan of care. Advised patient to call back with any issues or concerns.

## 2019-05-18 NOTE — Telephone Encounter (Signed)
Left message on patients voicemail to please return our call.   

## 2019-06-30 DIAGNOSIS — D126 Benign neoplasm of colon, unspecified: Secondary | ICD-10-CM | POA: Insufficient documentation

## 2019-06-30 HISTORY — DX: Benign neoplasm of colon, unspecified: D12.6

## 2019-12-18 DIAGNOSIS — U071 COVID-19: Secondary | ICD-10-CM | POA: Insufficient documentation

## 2019-12-18 HISTORY — DX: COVID-19: U07.1

## 2020-01-01 ENCOUNTER — Other Ambulatory Visit: Payer: Self-pay

## 2020-01-01 DIAGNOSIS — I119 Hypertensive heart disease without heart failure: Secondary | ICD-10-CM | POA: Insufficient documentation

## 2020-01-01 DIAGNOSIS — I10 Essential (primary) hypertension: Secondary | ICD-10-CM | POA: Insufficient documentation

## 2020-01-01 DIAGNOSIS — I2510 Atherosclerotic heart disease of native coronary artery without angina pectoris: Secondary | ICD-10-CM | POA: Insufficient documentation

## 2020-01-03 ENCOUNTER — Ambulatory Visit: Payer: Medicare Other | Admitting: Cardiology

## 2020-01-15 DIAGNOSIS — R1030 Lower abdominal pain, unspecified: Secondary | ICD-10-CM | POA: Insufficient documentation

## 2020-01-15 DIAGNOSIS — E785 Hyperlipidemia, unspecified: Secondary | ICD-10-CM | POA: Insufficient documentation

## 2020-01-15 HISTORY — DX: Lower abdominal pain, unspecified: R10.30

## 2020-01-16 ENCOUNTER — Ambulatory Visit (INDEPENDENT_AMBULATORY_CARE_PROVIDER_SITE_OTHER): Payer: Medicare Other | Admitting: Cardiology

## 2020-01-16 ENCOUNTER — Ambulatory Visit: Payer: Medicare Other | Admitting: Cardiology

## 2020-01-16 ENCOUNTER — Other Ambulatory Visit: Payer: Self-pay

## 2020-01-16 ENCOUNTER — Encounter: Payer: Self-pay | Admitting: Cardiology

## 2020-01-16 VITALS — BP 130/80 | HR 75 | Ht 71.0 in | Wt 224.6 lb

## 2020-01-16 DIAGNOSIS — E669 Obesity, unspecified: Secondary | ICD-10-CM

## 2020-01-16 DIAGNOSIS — I10 Essential (primary) hypertension: Secondary | ICD-10-CM

## 2020-01-16 DIAGNOSIS — I2510 Atherosclerotic heart disease of native coronary artery without angina pectoris: Secondary | ICD-10-CM | POA: Diagnosis not present

## 2020-01-16 DIAGNOSIS — E782 Mixed hyperlipidemia: Secondary | ICD-10-CM | POA: Diagnosis not present

## 2020-01-16 HISTORY — DX: Obesity, unspecified: E66.9

## 2020-01-16 NOTE — Patient Instructions (Signed)
Medication Instructions:  No medication changes. *If you need a refill on your cardiac medications before your next appointment, please call your pharmacy*   Lab Work: None ordered If you have labs (blood work) drawn today and your tests are completely normal, you will receive your results only by: . MyChart Message (if you have MyChart) OR . A paper copy in the mail If you have any lab test that is abnormal or we need to change your treatment, we will call you to review the results.   Testing/Procedures: None ordered   Follow-Up: At [facility], you and your health needs are our priority.  As part of our continuing mission to provide you with exceptional heart care, we have created designated Provider Care Teams.  These Care Teams include your primary Cardiologist (physician) and Advanced Practice Providers (APPs -  Physician Assistants and Nurse Practitioners) who all work together to provide you with the care you need, when you need it.  We recommend signing up for the patient portal called "MyChart".  Sign up information is provided on this After Visit Summary.  MyChart is used to connect with patients for Virtual Visits (Telemedicine).  Patients are able to view lab/test results, encounter notes, upcoming appointments, etc.  Non-urgent messages can be sent to your provider as well.   To learn more about what you can do with MyChart, go to [email or web address].    Your next appointment:   1 year(s)  The format for your next appointment:   In Person  Provider:   Kardie Tobb, DO   Other Instructions NA  

## 2020-01-16 NOTE — Progress Notes (Signed)
Cardiology Office Note:    Date:  01/16/2020   ID:  Billy Gallegos, DOB 1940-05-02, MRN [id number]  PCP:  Algis Greenhouse, MD  Cardiologist:  Berniece Salines, DO  Electrophysiologist:  None   Referring MD: Algis Greenhouse, MD   "I am doing well, I just ran 3 miles in 30 minutes"  History of Present Illness:    Billy Gallegos is a 79 y.o. male with a hx of  coronary artery disease status post CABG x5, hypertension, hyperlipidemia is here today for follow-up visit. Last saw the patient in December 2020 at that time he was prepared for his knee surgery.  He did have a negative stress test and he was cleared for surgery. The patient tells me since his surgery he is back to running and has been very active.  Before his visit today he just ran 3 miles on his treadmill in 30 minutes.  He has no chest pain, no shortness of breath.  He is enjoying his family life with his children, grandchildren and great-grandchildren. He is looking forward to spending the holiday season with them.  Past Medical History:  Diagnosis Date  . CAD (coronary artery disease)    CABG x5  . HTN (hypertension)   . Hyperlipemia     Past Surgical History:  Procedure Laterality Date  . CORONARY ARTERY BYPASS GRAFT     x 4  . HYDROCELE EXCISION    . INGUINAL HERNIA REPAIR      x2  . SKIN CANCER EXCISION      Current Medications: No outpatient medications have been marked as taking for the 01/16/20 encounter (Office Visit) with Berniece Salines, DO.     Allergies:   Oseltamivir, Penicillin g, Lisinopril, Metoprolol tartrate, Niacin, and Tamsulosin   Social History   Socioeconomic History  . Marital status: Married    Spouse name: Not on file  . Number of children: Not on file  . Years of education: Not on file  . Highest education level: Not on file  Occupational History  . Not on file  Tobacco Use  . Smoking status: Former Smoker    Types: Cigarettes    Quit date: 1981    Years since quitting: 40.9  .  Smokeless tobacco: Never Used  Vaping Use  . Vaping Use: Never used  Substance and Sexual Activity  . Alcohol use: Not Currently    Comment: occasionally  . Drug use: Never  . Sexual activity: Not on file  Other Topics Concern  . Not on file  Social History Narrative  . Not on file   Social Determinants of Health   Financial Resource Strain: Not on file  Food Insecurity: Not on file  Transportation Needs: Not on file  Physical Activity: Not on file  Stress: Not on file  Social Connections: Not on file     Family History: The patient's family history includes CAD in his mother; Emphysema in his father; Heart attack in his mother; Hypertension in his sister; Prostate cancer in his brother; Skin cancer in his sister.  ROS:   Review of Systems  Constitution: Negative for decreased appetite, fever and weight gain.  HENT: Negative for congestion, ear discharge, hoarse voice and sore throat.   Eyes: Negative for discharge, redness, vision loss in right eye and visual halos.  Cardiovascular: Negative for chest pain, dyspnea on exertion, leg swelling, orthopnea and palpitations.  Respiratory: Negative for cough, hemoptysis, shortness of breath and snoring.   Endocrine:  Negative for heat intolerance and polyphagia.  Hematologic/Lymphatic: Negative for bleeding problem. Does not bruise/bleed easily.  Skin: Negative for flushing, nail changes, rash and suspicious lesions.  Musculoskeletal: Negative for arthritis, joint pain, muscle cramps, myalgias, neck pain and stiffness.  Gastrointestinal: Negative for abdominal pain, bowel incontinence, diarrhea and excessive appetite.  Genitourinary: Negative for decreased libido, genital sores and incomplete emptying.  Neurological: Negative for brief paralysis, focal weakness, headaches and loss of balance.  Psychiatric/Behavioral: Negative for altered mental status, depression and suicidal ideas.  Allergic/Immunologic: Negative for HIV exposure  and persistent infections.    EKGs/Labs/Other Studies Reviewed:    The following studies were reviewed today:   EKG:  The ekg ordered today demonstrates sinus rhythm, heart rate 85 bpm.  Compared to prior EKG no significant change  Recent Labs: No results found for requested labs within last 8760 hours.  Recent Lipid Panel No results found for: CHOL, TRIG, HDL, CHOLHDL, VLDL, LDLCALC, LDLDIRECT  Physical Exam:    VS:  BP 130/80   Pulse 75   Ht 5\' 11"  (1.803 m)   Wt 224 lb 9.6 oz (101.9 kg)   SpO2 95%   BMI 31.33 kg/m     Wt Readings from Last 3 Encounters:  01/16/20 224 lb 9.6 oz (101.9 kg)  02/02/19 227 lb 9.6 oz (103.2 kg)     GEN: Well nourished, well developed in no acute distress HEENT: Normal NECK: No JVD; No carotid bruits LYMPHATICS: No lymphadenopathy CARDIAC: S1S2 noted,RRR, no murmurs, rubs, gallops RESPIRATORY:  Clear to auscultation without rales, wheezing or rhonchi  ABDOMEN: Soft, non-tender, non-distended, +bowel sounds, no guarding. EXTREMITIES: No edema, No cyanosis, no clubbing MUSCULOSKELETAL:  No deformity  SKIN: Warm and dry NEUROLOGIC:  Alert and oriented x 3, non-focal PSYCHIATRIC:  Normal affect, good insight  ASSESSMENT:    1. Coronary artery disease involving native coronary artery of native heart without angina pectoris   2. Benign hypertension   3. Mixed hyperlipidemia   4. Obesity (BMI 30-39.9)    PLAN:     He appears to be doing well from a cardiovascular standpoint he is happy with his activity level.  He does not have any symptoms and had not had any since his last visit.  Coronary artery disease, continue patient on his aspirin and atorvastatin as well as Zetia.  Hyperlipidemia-continue atorvastatin as well as Zetia.  The patient understands the need to lose weight with diet and exercise. We have discussed specific strategies for this.   The patient is in agreement with the above plan. The patient left the office in  stable condition.  The patient will follow up in   Medication Adjustments/Labs and Tests Ordered: Current medicines are reviewed at length with the patient today.  Concerns regarding medicines are outlined above.  No orders of the defined types were placed in this encounter.  No orders of the defined types were placed in this encounter.   Patient Instructions  Medication Instructions:  No medication changes. *If you need a refill on your cardiac medications before your next appointment, please call your pharmacy*   Lab Work: None ordered If you have labs (blood work) drawn today and your tests are completely normal, you will receive your results only by: Marland Kitchen MyChart Message (if you have MyChart) OR . A paper copy in the mail If you have any lab test that is abnormal or we need to change your treatment, we will call you to review the results.   Testing/Procedures: None  ordered   Follow-Up: At [facility], you and your health needs are our priority.  As part of our continuing mission to provide you with exceptional heart care, we have created designated Provider Care Teams.  These Care Teams include your primary Cardiologist (physician) and Advanced Practice Providers (APPs -  Physician Assistants and Nurse Practitioners) who all work together to provide you with the care you need, when you need it.  We recommend signing up for the patient portal called "MyChart".  Sign up information is provided on this After Visit Summary.  MyChart is used to connect with patients for Virtual Visits (Telemedicine).  Patients are able to view lab/test results, encounter notes, upcoming appointments, etc.  Non-urgent messages can be sent to your provider as well.   To learn more about what you can do with MyChart, go to [email or web address].    Your next appointment:   1 year(s)  The format for your next appointment:   In Person  Provider:   Berniece Salines, DO   Other Instructions NA      Adopting a Healthy Lifestyle.  Know what a healthy weight is for you (roughly BMI <25) and aim to maintain this   Aim for 7+ servings of fruits and vegetables daily   65-80+ fluid ounces of water or unsweet tea for healthy kidneys   Limit to max 1 drink of alcohol per day; avoid smoking/tobacco   Limit animal fats in diet for cholesterol and heart health - choose grass fed whenever available   Avoid highly processed foods, and foods high in saturated/trans fats   Aim for low stress - take time to unwind and care for your mental health   Aim for 150 min of moderate intensity exercise weekly for heart health, and weights twice weekly for bone health   Aim for 7-9 hours of sleep daily   When it comes to diets, agreement about the perfect plan isnt easy to find, even among the experts. Experts at the [employer] developed an idea known as the Healthy Eating Plate. Just imagine a plate divided into logical, healthy portions.   The emphasis is on diet quality:   Load up on vegetables and fruits - one-half of your plate: Aim for color and variety, and remember that potatoes dont count.   Go for whole grains - one-quarter of your plate: Whole wheat, barley, wheat berries, quinoa, oats, brown rice, and foods made with them. If you want pasta, go with whole wheat pasta.   Protein power - one-quarter of your plate: Fish, chicken, beans, and nuts are all healthy, versatile protein sources. Limit red meat.   The diet, however, does go beyond the plate, offering a few other suggestions.   Use healthy plant oils, such as olive, canola, soy, corn, sunflower and peanut. Check the labels, and avoid partially hydrogenated oil, which have unhealthy trans fats.   If youre thirsty, drink water. Coffee and tea are good in moderation, but skip sugary drinks and limit milk and dairy products to one or two daily servings.   The type of carbohydrate in the diet is more important  than the amount. Some sources of carbohydrates, such as vegetables, fruits, whole grains, and beans-are healthier than others.   Finally, stay active  Signed, Berniece Salines, DO  01/16/2020 11:21 AM    [place]

## 2020-01-16 NOTE — Addendum Note (Signed)
Addended by: Birder Robson on: 01/16/2020 01:22 PM   Modules accepted: Orders

## 2020-07-24 ENCOUNTER — Other Ambulatory Visit: Payer: Self-pay

## 2020-07-24 ENCOUNTER — Ambulatory Visit (INDEPENDENT_AMBULATORY_CARE_PROVIDER_SITE_OTHER): Payer: Medicare Other | Admitting: Cardiology

## 2020-07-24 ENCOUNTER — Encounter: Payer: Self-pay | Admitting: Cardiology

## 2020-07-24 VITALS — BP 134/82 | HR 66 | Ht 71.0 in | Wt 219.0 lb

## 2020-07-24 DIAGNOSIS — R7303 Prediabetes: Secondary | ICD-10-CM | POA: Diagnosis not present

## 2020-07-24 DIAGNOSIS — I10 Essential (primary) hypertension: Secondary | ICD-10-CM | POA: Diagnosis not present

## 2020-07-24 DIAGNOSIS — E782 Mixed hyperlipidemia: Secondary | ICD-10-CM

## 2020-07-24 DIAGNOSIS — I2510 Atherosclerotic heart disease of native coronary artery without angina pectoris: Secondary | ICD-10-CM | POA: Diagnosis not present

## 2020-07-24 DIAGNOSIS — R0602 Shortness of breath: Secondary | ICD-10-CM

## 2020-07-24 DIAGNOSIS — E669 Obesity, unspecified: Secondary | ICD-10-CM

## 2020-07-24 MED ORDER — FUROSEMIDE 40 MG PO TABS: ORAL_TABLET | ORAL | 3 refills | Status: DC

## 2020-07-24 MED ORDER — POTASSIUM CHLORIDE CRYS ER 20 MEQ PO TBCR: EXTENDED_RELEASE_TABLET | ORAL | 3 refills | Status: DC

## 2020-07-24 NOTE — Progress Notes (Signed)
Cardiology Office Note:    Date:  07/24/2020   ID:  Billy Gallegos, DOB 1940/03/31, MRN [id number]  PCP:  Algis Greenhouse, MD  Cardiologist:  Berniece Salines, DO  Electrophysiologist:  None   Referring MD: Algis Greenhouse, MD   Chief Complaint  Patient presents with   Follow-up  " I am doing well"   History of Present Illness:    Billy Gallegos is a 80 y.o. male with a hx of coronary disease status post CABG x5, hypertension, hyperlipidemia is here today for follow-up visit.  I saw the patient in December 2021 at that time he appeared to be doing well from a cardiovascular standpoint.  We continued his medication regimen. The patient requested to be seen as he had had some intermittent shortness of breath over the last several months.  He notes that with this he had 1 episode of chest discomfort.  But mostly he has had shortness of breath when he has walking on an incline.  He has not been able to fully exercise.  Past Medical History:  Diagnosis Date   CAD (coronary artery disease)    CABG x5   HTN (hypertension)    Hyperlipemia     Past Surgical History:  Procedure Laterality Date   CORONARY ARTERY BYPASS GRAFT     x 4   HYDROCELE EXCISION     INGUINAL HERNIA REPAIR      x2   KNEE SURGERY Right    SKIN CANCER EXCISION      Current Medications: Current Meds  Medication Sig   allopurinol (ZYLOPRIM) 100 MG tablet TAKE TWO TABLETS BY MOUTH ONCE DAILY PREVENT GOUT.   amLODipine (NORVASC) 5 MG tablet Take 5 mg by mouth daily.   aspirin 81 MG EC tablet Take 81 mg by mouth daily.   atorvastatin (LIPITOR) 80 MG tablet Take 80 mg by mouth daily.   colchicine 0.6 MG tablet    ezetimibe (ZETIA) 10 MG tablet Take 10 mg by mouth daily.   irbesartan (AVAPRO) 300 MG tablet Take 300 mg by mouth every morning.   sildenafil (REVATIO) 20 MG tablet Take 20 mg by mouth as needed (erectile dysfunction).   tamsulosin (FLOMAX) 0.4 MG CAPS capsule Take 0.4 mg by mouth daily.      Allergies:   Oseltamivir, Penicillin g, Lisinopril, Metoprolol tartrate, and Niacin   Social History   Socioeconomic History   Marital status: Married    Spouse name: Not on file   Number of children: Not on file   Years of education: Not on file   Highest education level: Not on file  Occupational History   Not on file  Tobacco Use   Smoking status: Former    Pack years: 0.00    Types: Cigarettes    Quit date: 60    Years since quitting: 41.4   Smokeless tobacco: Never  Vaping Use   Vaping Use: Never used  Substance and Sexual Activity   Alcohol use: Not Currently    Comment: occasionally   Drug use: Never   Sexual activity: Not on file  Other Topics Concern   Not on file  Social History Narrative   Not on file   Social Determinants of Health   Financial Resource Strain: Not on file  Food Insecurity: Not on file  Transportation Needs: Not on file  Physical Activity: Not on file  Stress: Not on file  Social Connections: Not on file     Family History:  The patient's family history includes CAD in his mother; Emphysema in his father; Heart attack in his mother; Hypertension in his sister; Prostate cancer in his brother; Skin cancer in his sister.  ROS:   Review of Systems  Constitution: Negative for decreased appetite, fever and weight gain.  HENT: Negative for congestion, ear discharge, hoarse voice and sore throat.   Eyes: Negative for discharge, redness, vision loss in right eye and visual halos.  Cardiovascular: Negative for chest pain, dyspnea on exertion, leg swelling, orthopnea and palpitations.  Respiratory: Negative for cough, hemoptysis, shortness of breath and snoring.   Endocrine: Negative for heat intolerance and polyphagia.  Hematologic/Lymphatic: Negative for bleeding problem. Does not bruise/bleed easily.  Skin: Negative for flushing, nail changes, rash and suspicious lesions.  Musculoskeletal: Negative for arthritis, joint pain, muscle  cramps, myalgias, neck pain and stiffness.  Gastrointestinal: Negative for abdominal pain, bowel incontinence, diarrhea and excessive appetite.  Genitourinary: Negative for decreased libido, genital sores and incomplete emptying.  Neurological: Negative for brief paralysis, focal weakness, headaches and loss of balance.  Psychiatric/Behavioral: Negative for altered mental status, depression and suicidal ideas.  Allergic/Immunologic: Negative for HIV exposure and persistent infections.    EKGs/Labs/Other Studies Reviewed:    The following studies were reviewed today:   EKG: None today  Transthoracic echocardiogram May 17, 2019 IMPRESSIONS   1. Left ventricular ejection fraction, by estimation, is 60 to 65%. The  left ventricle has normal function. The left ventricle has no regional  wall motion abnormalities. Left ventricular diastolic parameters are  consistent with Grade II diastolic  dysfunction (pseudonormalization).   2. The inferior vena cava is normal in size with greater than 50%  respiratory variability, suggesting right atrial pressure of 3 mmHg.   FINDINGS   Left Ventricle: Left ventricular ejection fraction, by estimation, is 60  to 65%. The left ventricle has normal function. The left ventricle has no  regional wall motion abnormalities. The left ventricular internal cavity  size was normal in size. There is   no left ventricular hypertrophy. Left ventricular diastolic parameters  are consistent with Grade II diastolic dysfunction (pseudonormalization).   Right Ventricle: The right ventricular size is normal. No increase in  right ventricular wall thickness. Right ventricular systolic function is  normal. There is normal pulmonary artery systolic pressure. The tricuspid  regurgitant velocity is 1.29 m/s, and   with an assumed right atrial pressure of 3 mmHg, the estimated right  ventricular systolic pressure is 9.7 mmHg.   Left Atrium: Left atrial size was normal  in size.   Right Atrium: Right atrial size was normal in size.   Pericardium: There is no evidence of pericardial effusion.   Mitral Valve: The mitral valve is normal in structure. Normal mobility of  the mitral valve leaflets. No evidence of mitral valve regurgitation. No  evidence of mitral valve stenosis.   Tricuspid Valve: The tricuspid valve is normal in structure. Tricuspid  valve regurgitation is trivial. No evidence of tricuspid stenosis.   Aortic Valve: The aortic valve is normal in structure. Aortic valve  regurgitation is not visualized. No aortic stenosis is present.   Pulmonic Valve: The pulmonic valve was normal in structure. Pulmonic valve  regurgitation is not visualized. No evidence of pulmonic stenosis.   Aorta: The aortic root is normal in size and structure.   Venous: The inferior vena cava is normal in size with greater than 50%  respiratory variability, suggesting right atrial pressure of 3 mmHg.   IAS/Shunts: No  atrial level shunt detected by color flow Doppler.   Recent Labs: No results found for requested labs within last 8760 hours.  Recent Lipid Panel No results found for: CHOL, TRIG, HDL, CHOLHDL, VLDL, LDLCALC, LDLDIRECT  Physical Exam:    VS:  BP 134/82 (BP Location: Right Arm, Patient Position: Sitting, Cuff Size: Normal)   Pulse 66   Ht 5\' 11"  (1.803 m)   Wt 219 lb (99.3 kg)   SpO2 96%   BMI 30.54 kg/m     Wt Readings from Last 3 Encounters:  07/24/20 219 lb (99.3 kg)  01/16/20 224 lb 9.6 oz (101.9 kg)  02/02/19 227 lb 9.6 oz (103.2 kg)     GEN: Well nourished, well developed in no acute distress HEENT: Normal NECK: No JVD; No carotid bruits LYMPHATICS: No lymphadenopathy CARDIAC: S1S2 noted,RRR, no murmurs, rubs, gallops RESPIRATORY:  Clear to auscultation without rales, wheezing or rhonchi  ABDOMEN: Soft, non-tender, non-distended, +bowel sounds, no guarding. EXTREMITIES: +1 bilateral lower extremity edema, No cyanosis, no  clubbing MUSCULOSKELETAL:  No deformity  SKIN: Warm and dry NEUROLOGIC:  Alert and oriented x 3, non-focal PSYCHIATRIC:  Normal affect, good insight  ASSESSMENT:    1. Coronary artery disease involving native coronary artery of native heart without angina pectoris   2. Hypertension, unspecified type   3. Mixed hyperlipidemia   4. Prediabetes   5. Obesity (BMI 30-39.9)    PLAN:     For shortness of breath and bilateral leg edema we will start with cautious diuretics Lasix 40 mg Tuesday Thursdays and Saturdays with potassium supplement.  We will repeat an echocardiogram to make sure his LV function is still normal and there is no evidence of any cardiomyopathy.  If the patient does not improve with the diuretic therapy and his echocardiogram is normal we will pursue ischemic evaluation in this patient.  For now continue his aspirin, Lipitor as well as Zetia.  The patient understands the need to lose weight with diet and exercise. We have discussed specific strategies for this.  The patient is in agreement with the above plan. The patient left the office in stable condition.  The patient will follow up in   Medication Adjustments/Labs and Tests Ordered: Current medicines are reviewed at length with the patient today.  Concerns regarding medicines are outlined above.  No orders of the defined types were placed in this encounter.  No orders of the defined types were placed in this encounter.   Patient Instructions  Medication Instructions:   Your physician has recommended you make the following change in your medication:  START: Lasix 40 mg Tuesday, Thursday, and Saturday START: Potassium 20 meq Tuesday, Thursday and Saturday  *If you need a refill on your cardiac medications before your next appointment, please call your pharmacy*   Lab Work:  Your physician recommends that you return for lab work in:  TODAY: BMET, [place], CBC   If you have labs (blood work) drawn today and  your tests are completely normal, you will receive your results only by: [place] (if you have MyChart) OR A paper copy in the mail If you have any lab test that is abnormal or we need to change your treatment, we will call you to review the results.   Testing/Procedures:  Your physician has requested that you have an echocardiogram. Echocardiography is a painless test that uses sound waves to create images of your heart. It provides your doctor with information about the size and shape of  your heart and how well your heart's chambers and valves are working. This procedure takes approximately one hour. There are no restrictions for this procedure.    Follow-Up: At [facility], you and your health needs are our priority.  As part of our continuing mission to provide you with exceptional heart care, we have created designated Provider Care Teams.  These Care Teams include your primary Cardiologist (physician) and Advanced Practice Providers (APPs -  Physician Assistants and Nurse Practitioners) who all work together to provide you with the care you need, when you need it.  We recommend signing up for the patient portal called "MyChart".  Sign up information is provided on this After Visit Summary.  MyChart is used to connect with patients for Virtual Visits (Telemedicine).  Patients are able to view lab/test results, encounter notes, upcoming appointments, etc.  Non-urgent messages can be sent to your provider as well.   To learn more about what you can do with MyChart, go to [email or web address].    Your next appointment:   8 week(s)  The format for your next appointment:   In Person  Provider:   Berniece Salines, DO   Other Instructions  Echocardiogram An echocardiogram is a test that uses sound waves (ultrasound) to produce images of the heart. Images from an echocardiogram can provide important information about: Heart size and shape. The size and thickness and movement of  your heart's walls. Heart muscle function and strength. Heart valve function or if you have stenosis. Stenosis is when the heart valves are too narrow. If blood is flowing backward through the heart valves (regurgitation). A tumor or infectious growth around the heart valves. Areas of heart muscle that are not working well because of poor blood flow or injury from a heart attack. Aneurysm detection. An aneurysm is a weak or damaged part of an artery wall. The wall bulges out from the normal force of blood pumping through the body. Tell a health care provider about: Any allergies you have. All medicines you are taking, including vitamins, herbs, eye drops, creams, and over-the-counter medicines. Any blood disorders you have. Any surgeries you have had. Any medical conditions you have. Whether you are pregnant or may be pregnant. What are the risks? Generally, this is a safe test. However, problems may occur, including an allergic reaction to dye (contrast) that may be used during the test. What happens before the test? No specific preparation is needed. You may eat and drink normally. What happens during the test?  You will take off your clothes from the waist up and put on a hospital gown. Electrodes or electrocardiogram (ECG)patches may be placed on your chest. The electrodes or patches are then connected to a device that monitors your heart rate and rhythm. You will lie down on a table for an ultrasound exam. A gel will be applied to your chest to help sound waves pass through your skin. A handheld device, called a transducer, will be pressed against your chest and moved over your heart. The transducer produces sound waves that travel to your heart and bounce back (or "echo" back) to the transducer. These sound waves will be captured in real-time and changed into images of your heart that can be viewed on a video monitor. The images will be recorded on a computer and reviewed by your health  care provider. You may be asked to change positions or hold your breath for a short time. This makes it easier to get different views or  better views of your heart. In some cases, you may receive contrast through an IV in one of your veins. This can improve the quality of the pictures from your heart. The procedure may vary among health care providers and hospitals. What can I expect after the test? You may return to your normal, everyday life, including diet, activities, andmedicines, unless your health care provider tells you not to do that. Follow these instructions at home: It is up to you to get the results of your test. Ask your health care provider, or the department that is doing the test, when your results will be ready. Keep all follow-up visits. This is important. Summary An echocardiogram is a test that uses sound waves (ultrasound) to produce images of the heart. Images from an echocardiogram can provide important information about the size and shape of your heart, heart muscle function, heart valve function, and other possible heart problems. You do not need to do anything to prepare before this test. You may eat and drink normally. After the echocardiogram is completed, you may return to your normal, everyday life, unless your health care provider tells you not to do that. This information is not intended to replace advice given to you by your health care provider. Make sure you discuss any questions you have with your healthcare provider. Document Revised: 09/12/2019 Document Reviewed: 09/12/2019 Elsevier Patient Education  2022 [place].    Adopting a Healthy Lifestyle.  Know what a healthy weight is for you (roughly BMI <25) and aim to maintain this   Aim for 7+ servings of fruits and vegetables daily   65-80+ fluid ounces of water or unsweet tea for healthy kidneys   Limit to max 1 drink of alcohol per day; avoid smoking/tobacco   Limit animal fats in diet for  cholesterol and heart health - choose grass fed whenever available   Avoid highly processed foods, and foods high in saturated/trans fats   Aim for low stress - take time to unwind and care for your mental health   Aim for 150 min of moderate intensity exercise weekly for heart health, and weights twice weekly for bone health   Aim for 7-9 hours of sleep daily   When it comes to diets, agreement about the perfect plan isnt easy to find, even among the experts. Experts at the [place] developed an idea known as the Healthy Eating Plate. Just imagine a plate divided into logical, healthy portions.   The emphasis is on diet quality:   Load up on vegetables and fruits - one-half of your plate: Aim for color and variety, and remember that potatoes dont count.   Go for whole grains - one-quarter of your plate: Whole wheat, barley, wheat berries, quinoa, oats, brown rice, and foods made with them. If you want pasta, go with whole wheat pasta.   Protein power - one-quarter of your plate: Fish, chicken, beans, and nuts are all healthy, versatile protein sources. Limit red meat.   The diet, however, does go beyond the plate, offering a few other suggestions.   Use healthy plant oils, such as olive, canola, soy, corn, sunflower and peanut. Check the labels, and avoid partially hydrogenated oil, which have unhealthy trans fats.   If youre thirsty, drink water. Coffee and tea are good in moderation, but skip sugary drinks and limit milk and dairy products to one or two daily servings.   The type of carbohydrate in the diet is more important  than the amount. Some sources of carbohydrates, such as vegetables, fruits, whole grains, and beans-are healthier than others.   Finally, stay active  Signed, Berniece Salines, DO  07/24/2020 2:08 PM    [facility]

## 2020-07-24 NOTE — Patient Instructions (Signed)
Medication Instructions:   Your physician has recommended you make the following change in your medication:  START: Lasix 40 mg Tuesday, Thursday, and Saturday START: Potassium 20 meq Tuesday, Thursday and Saturday  *If you need a refill on your cardiac medications before your next appointment, please call your pharmacy*   Lab Work:  Your physician recommends that you return for lab work in:  TODAY: BMET, [place] Gallegos, CBC   If you have labs (blood work) drawn today and your tests are completely normal, you will receive your results only by: [place] (if you have MyChart) OR A paper copy in the mail If you have any lab test that is abnormal or we need to change your treatment, we will call you to review the results.   Testing/Procedures:  Your physician has requested that you have an echocardiogram. Echocardiography is a painless test that uses sound waves to create images of your heart. It provides your doctor with information about the size and shape of your heart and how well your heart's chambers and valves are working. This procedure takes approximately one hour. There are no restrictions for this procedure.    Follow-Up: At [facility], you and your health needs are our priority.  As part of our continuing mission to provide you with exceptional heart care, we have created designated Provider Care Teams.  These Care Teams include your primary Cardiologist (physician) and Advanced Practice Providers (APPs -  Physician Assistants and Nurse Practitioners) who all work together to provide you with the care you need, when you need it.  We recommend signing up for the patient portal called "MyChart".  Sign up information is provided on this After Visit Summary.  MyChart is used to connect with patients for Virtual Visits (Telemedicine).  Patients are able to view lab/test results, encounter notes, upcoming appointments, etc.  Non-urgent messages can be sent to your provider as well.    To learn more about what you can do with MyChart, go to [email or web address].    Your next appointment:   8 week(s)  The format for your next appointment:   In Person  Provider:   Berniece Salines, DO   Other Instructions  Echocardiogram An echocardiogram is a test that uses sound waves (ultrasound) to produce images of the heart. Images from an echocardiogram can provide important information about: Heart size and shape. The size and thickness and movement of your heart's walls. Heart muscle function and strength. Heart valve function or if you have stenosis. Stenosis is when the heart valves are too narrow. If blood is flowing backward through the heart valves (regurgitation). A tumor or infectious growth around the heart valves. Areas of heart muscle that are not working well because of poor blood flow or injury from a heart attack. Aneurysm detection. An aneurysm is a weak or damaged part of an artery wall. The wall bulges out from the normal force of blood pumping through the body. Tell a health care provider about: Any allergies you have. All medicines you are taking, including vitamins, herbs, eye drops, creams, and over-the-counter medicines. Any blood disorders you have. Any surgeries you have had. Any medical conditions you have. Whether you are pregnant or may be pregnant. What are the risks? Generally, this is a safe test. However, problems may occur, including an allergic reaction to dye (contrast) that may be used during the test. What happens before the test? No specific preparation is needed. You may eat and drink normally. What happens during the  test?  You will take off your clothes from the waist up and put on a hospital gown. Electrodes or electrocardiogram (ECG)patches may be placed on your chest. The electrodes or patches are then connected to a device that monitors your heart rate and rhythm. You will lie down on a table for an ultrasound exam. A gel  will be applied to your chest to help sound waves pass through your skin. A handheld device, called a transducer, will be pressed against your chest and moved over your heart. The transducer produces sound waves that travel to your heart and bounce back (or "echo" back) to the transducer. These sound waves will be captured in real-time and changed into images of your heart that can be viewed on a video monitor. The images will be recorded on a computer and reviewed by your health care provider. You may be asked to change positions or hold your breath for a short time. This makes it easier to get different views or better views of your heart. In some cases, you may receive contrast through an IV in one of your veins. This can improve the quality of the pictures from your heart. The procedure may vary among health care providers and hospitals. What can I expect after the test? You may return to your normal, everyday life, including diet, activities, andmedicines, unless your health care provider tells you not to do that. Follow these instructions at home: It is up to you to get the results of your test. Ask your health care provider, or the department that is doing the test, when your results will be ready. Keep all follow-up visits. This is important. Summary An echocardiogram is a test that uses sound waves (ultrasound) to produce images of the heart. Images from an echocardiogram can provide important information about the size and shape of your heart, heart muscle function, heart valve function, and other possible heart problems. You do not need to do anything to prepare before this test. You may eat and drink normally. After the echocardiogram is completed, you may return to your normal, everyday life, unless your health care provider tells you not to do that. This information is not intended to replace advice given to you by your health care provider. Make sure you discuss any questions you have  with your healthcare provider. Document Revised: 09/12/2019 Document Reviewed: 09/12/2019 Elsevier Patient Education  2022 [employer].

## 2020-07-25 LAB — CBC WITH DIFFERENTIAL/PLATELET
Basophils Absolute: 0.1 10*3/uL (ref 0.0–0.2)
Basos: 1 %
EOS (ABSOLUTE): 0.1 10*3/uL (ref 0.0–0.4)
Eos: 2 %
Hematocrit: 40 % (ref 37.5–51.0)
Hemoglobin: 13.7 g/dL (ref 13.0–17.7)
Immature Grans (Abs): 0 10*3/uL (ref 0.0–0.1)
Immature Granulocytes: 1 %
Lymphocytes Absolute: 1.4 10*3/uL (ref 0.7–3.1)
Lymphs: 19 %
MCH: 31.1 pg (ref 26.6–33.0)
MCHC: 34.3 g/dL (ref 31.5–35.7)
MCV: 91 fL (ref 79–97)
Monocytes Absolute: 0.7 10*3/uL (ref 0.1–0.9)
Monocytes: 10 %
Neutrophils Absolute: 5.1 10*3/uL (ref 1.4–7.0)
Neutrophils: 67 %
Platelets: 202 10*3/uL (ref 150–450)
RBC: 4.4 x10E6/uL (ref 4.14–5.80)
RDW: 13 % (ref 11.6–15.4)
WBC: 7.4 10*3/uL (ref 3.4–10.8)

## 2020-07-25 LAB — BASIC METABOLIC PANEL
BUN/Creatinine Ratio: 18 (ref 10–24)
BUN: 19 mg/dL (ref 8–27)
CO2: 22 mmol/L (ref 20–29)
Calcium: 9.5 mg/dL (ref 8.6–10.2)
Chloride: 103 mmol/L (ref 96–106)
Creatinine, Ser: 1.05 mg/dL (ref 0.76–1.27)
Glucose: 116 mg/dL — ABNORMAL HIGH (ref 65–99)
Potassium: 4.5 mmol/L (ref 3.5–5.2)
Sodium: 139 mmol/L (ref 134–144)
eGFR: 73 mL/min/{1.73_m2} (ref >59)

## 2020-07-25 LAB — MAGNESIUM: Magnesium: 1.9 mg/dL (ref 1.6–2.3)

## 2020-08-13 ENCOUNTER — Telehealth: Payer: Self-pay | Admitting: Cardiology

## 2020-08-13 NOTE — Telephone Encounter (Signed)
Called patient to let him know the medications will not effect his echo tomorrow. He verbalized understanding.

## 2020-08-13 NOTE — Telephone Encounter (Signed)
Pt c/o medication issue: 1. Name of Medication: Potassium 20 MEQ 2. How are you currently taking this medication (dosage and times per day)? Tuesday, Thursday and Saturday  3. Are you having a reaction (difficulty breathing--STAT)?  No  4. What is your medication issue? Patient states that there where was also lasix and would like to know if this going to effect his ECHO tomorrow.

## 2020-08-14 ENCOUNTER — Ambulatory Visit (INDEPENDENT_AMBULATORY_CARE_PROVIDER_SITE_OTHER): Payer: Medicare Other

## 2020-08-14 ENCOUNTER — Other Ambulatory Visit: Payer: Self-pay

## 2020-08-14 DIAGNOSIS — R0602 Shortness of breath: Secondary | ICD-10-CM

## 2020-08-14 LAB — ECHOCARDIOGRAM COMPLETE
Area-P 1/2: 3.23 cm2
S' Lateral: 3.6 cm

## 2020-08-14 NOTE — Progress Notes (Signed)
Complete echocardiogram performed.  Jimmy Hamna Asa RDCS, RVT  

## 2020-10-01 ENCOUNTER — Ambulatory Visit (INDEPENDENT_AMBULATORY_CARE_PROVIDER_SITE_OTHER): Payer: Medicare Other | Admitting: Cardiology

## 2020-10-01 ENCOUNTER — Other Ambulatory Visit: Payer: Self-pay

## 2020-10-01 ENCOUNTER — Encounter: Payer: Self-pay | Admitting: Cardiology

## 2020-10-01 VITALS — BP 100/70 | HR 66 | Ht 71.0 in | Wt 221.0 lb

## 2020-10-01 DIAGNOSIS — I2510 Atherosclerotic heart disease of native coronary artery without angina pectoris: Secondary | ICD-10-CM

## 2020-10-01 DIAGNOSIS — E669 Obesity, unspecified: Secondary | ICD-10-CM | POA: Diagnosis not present

## 2020-10-01 DIAGNOSIS — I10 Essential (primary) hypertension: Secondary | ICD-10-CM | POA: Diagnosis not present

## 2020-10-01 DIAGNOSIS — E782 Mixed hyperlipidemia: Secondary | ICD-10-CM

## 2020-10-01 MED ORDER — FUROSEMIDE 40 MG PO TABS: 40.0000 mg | ORAL_TABLET | Freq: Every day | ORAL | 3 refills | Status: DC | PRN

## 2020-10-01 NOTE — Progress Notes (Signed)
Cardiology Office Note:    Date:  10/01/2020   ID:  Billy Gallegos, DOB 1940-12-24, MRN [id number]  PCP:  Algis Greenhouse, MD  Cardiologist:  Berniece Salines, DO  Electrophysiologist:  None   Referring MD: Algis Greenhouse, MD   Chief Complaint  Patient presents with   Follow-up    History of Present Illness:    Billy Gallegos is a 80 y.o. male with a hx of coronary disease status post CABG x5, hypertension, hyperlipidemia is here today for follow-up visit.   I saw the patient in December 2021 at that time he appeared to be doing well from a cardiovascular standpoint.   I saw the patient in June 2022 at that time he was experiencing some shortness of breath and bilateral leg edema.  I started him on diuretics and repeated an echocardiogram.  His symptoms had improved with the diuretics and his echocardiogram was normal.  Unfortunately he had a family reunion when he went to [place] and did get COVID.  So now he is just recovering from that.  So is very hard for the patient to be able to tell me if he is experiencing t symptoms from residual COVID symptoms or any worsening shortness of breath.  He is going to continue to monitor this.   Past Medical History:  Diagnosis Date   CAD (coronary artery disease)    CABG x5   HTN (hypertension)    Hyperlipemia     Past Surgical History:  Procedure Laterality Date   CORONARY ARTERY BYPASS GRAFT     x 4   HYDROCELE EXCISION     INGUINAL HERNIA REPAIR      x2   KNEE SURGERY Right    SKIN CANCER EXCISION      Current Medications: Current Meds  Medication Sig   allopurinol (ZYLOPRIM) 100 MG tablet TAKE TWO TABLETS BY MOUTH ONCE DAILY PREVENT GOUT.   amLODipine (NORVASC) 5 MG tablet Take 5 mg by mouth daily.   aspirin 81 MG EC tablet Take 81 mg by mouth daily.   atorvastatin (LIPITOR) 80 MG tablet Take 80 mg by mouth daily.   colchicine 0.6 MG tablet Take 0.6 mg by mouth daily as needed (gout).   ezetimibe (ZETIA) 10 MG tablet  Take 10 mg by mouth daily.   furosemide (LASIX) 40 MG tablet Take 1 tablet (40 mg total) by mouth daily as needed for edema (weight gain greater than 5 pounds.).   irbesartan (AVAPRO) 300 MG tablet Take 300 mg by mouth every morning.   potassium chloride SA (KLOR-CON) 20 MEQ tablet Take once on Tuesday, Thursday and Saturday   sildenafil (REVATIO) 20 MG tablet Take 20 mg by mouth as needed (erectile dysfunction).   tamsulosin (FLOMAX) 0.4 MG CAPS capsule Take 0.4 mg by mouth daily.   [DISCONTINUED] furosemide (LASIX) 40 MG tablet Take once on Tuesday, Thursday and Saturday     Allergies:   Oseltamivir, Penicillin g, Lisinopril, Metoprolol tartrate, and Niacin   Social History   Socioeconomic History   Marital status: Married    Spouse name: Not on file   Number of children: Not on file   Years of education: Not on file   Highest education level: Not on file  Occupational History   Not on file  Tobacco Use   Smoking status: Former    Types: Cigarettes    Quit date: 1981    Years since quitting: 41.6   Smokeless tobacco: Never  Vaping  Use   Vaping Use: Never used  Substance and Sexual Activity   Alcohol use: Not Currently    Comment: occasionally   Drug use: Never   Sexual activity: Not on file  Other Topics Concern   Not on file  Social History Narrative   Not on file   Social Determinants of Health   Financial Resource Strain: Not on file  Food Insecurity: Not on file  Transportation Needs: Not on file  Physical Activity: Not on file  Stress: Not on file  Social Connections: Not on file     Family History: The patient's family history includes CAD in his mother; Emphysema in his father; Heart attack in his mother; Hypertension in his sister; Prostate cancer in his brother; Skin cancer in his sister.  ROS:   Review of Systems  Constitution: Negative for decreased appetite, fever and weight gain.  HENT: Negative for congestion, ear discharge, hoarse voice and sore  throat.   Eyes: Negative for discharge, redness, vision loss in right eye and visual halos.  Cardiovascular: Negative for chest pain, dyspnea on exertion, leg swelling, orthopnea and palpitations.  Respiratory: Negative for cough, hemoptysis, shortness of breath and snoring.   Endocrine: Negative for heat intolerance and polyphagia.  Hematologic/Lymphatic: Negative for bleeding problem. Does not bruise/bleed easily.  Skin: Negative for flushing, nail changes, rash and suspicious lesions.  Musculoskeletal: Negative for arthritis, joint pain, muscle cramps, myalgias, neck pain and stiffness.  Gastrointestinal: Negative for abdominal pain, bowel incontinence, diarrhea and excessive appetite.  Genitourinary: Negative for decreased libido, genital sores and incomplete emptying.  Neurological: Negative for brief paralysis, focal weakness, headaches and loss of balance.  Psychiatric/Behavioral: Negative for altered mental status, depression and suicidal ideas.  Allergic/Immunologic: Negative for HIV exposure and persistent infections.    EKGs/Labs/Other Studies Reviewed:    The following studies were reviewed today:   EKG: None today  Transthoracic echocardiogram August 14, 2020 IMPRESSIONS     1. Left ventricular ejection fraction, by estimation, is 55 to 60%. The  left ventricle has normal function. Left ventricular endocardial border  not optimally defined to evaluate regional wall motion. There is mild  concentric left ventricular  hypertrophy. Left ventricular diastolic parameters were normal.   2. Right ventricular systolic function is mildly reduced. The right  ventricular size is normal.   3. The mitral valve is normal in structure. No evidence of mitral valve  regurgitation. No evidence of mitral stenosis.   4. The aortic valve is tricuspid. Aortic valve regurgitation is not  visualized. No aortic stenosis is present.   5. There is mild dilatation of the ascending aorta,  measuring 38 mm.   6. The inferior vena cava is normal in size with greater than 50%  respiratory variability, suggesting right atrial pressure of 3 mmHg.   FINDINGS   Left Ventricle: Left ventricular ejection fraction, by estimation, is 55  to 60%. The left ventricle has normal function. Left ventricular  endocardial border not optimally defined to evaluate regional wall motion.  The left ventricular internal cavity  size was normal in size. There is mild concentric left ventricular  hypertrophy. Left ventricular diastolic parameters were normal. Normal  left ventricular filling pressure.   Right Ventricle: The right ventricular size is normal. No increase in  right ventricular wall thickness. Right ventricular systolic function is  mildly reduced.   Left Atrium: Left atrial size was normal in size.   Right Atrium: Right atrial size was normal in size.   Pericardium:  There is no evidence of pericardial effusion.   Mitral Valve: The mitral valve is normal in structure. No evidence of  mitral valve regurgitation. No evidence of mitral valve stenosis.   Tricuspid Valve: The tricuspid valve is normal in structure. Tricuspid  valve regurgitation is not demonstrated. No evidence of tricuspid  stenosis.   Aortic Valve: The aortic valve is tricuspid. Aortic valve regurgitation is  not visualized. No aortic stenosis is present.   Pulmonic Valve: The pulmonic valve was normal in structure. Pulmonic valve  regurgitation is not visualized. No evidence of pulmonic stenosis.   Aorta: The aortic root is normal in size and structure and the aortic arch  was not well visualized. There is mild dilatation of the ascending aorta,  measuring 38 mm.   Venous: The pulmonary veins were not well visualized. The inferior vena  cava is normal in size with greater than 50% respiratory variability,  suggesting right atrial pressure of 3 mmHg.   IAS/Shunts: No atrial level shunt detected by color flow  Doppler.   Recent Labs: 07/24/2020: BUN 19; Creatinine, Ser 1.05; Hemoglobin 13.7; Magnesium 1.9; Platelets 202; Potassium 4.5; Sodium 139  Recent Lipid Panel No results found for: CHOL, TRIG, HDL, CHOLHDL, VLDL, LDLCALC, LDLDIRECT  Physical Exam:    VS:  BP 100/70 (BP Location: Right Arm, Patient Position: Sitting, Cuff Size: Normal)   Pulse 66   Ht '5\' 11"'$  (1.803 m)   Wt 221 lb (100.2 kg)   SpO2 94%   BMI 30.82 kg/m     Wt Readings from Last 3 Encounters:  10/01/20 221 lb (100.2 kg)  07/24/20 219 lb (99.3 kg)  01/16/20 224 lb 9.6 oz (101.9 kg)     GEN: Well nourished, well developed in no acute distress HEENT: Normal NECK: No JVD; No carotid bruits LYMPHATICS: No lymphadenopathy CARDIAC: S1S2 noted,RRR, no murmurs, rubs, gallops RESPIRATORY:  Clear to auscultation without rales, wheezing or rhonchi  ABDOMEN: Soft, non-tender, non-distended, +bowel sounds, no guarding. EXTREMITIES: No edema, No cyanosis, no clubbing MUSCULOSKELETAL:  No deformity  SKIN: Warm and dry NEUROLOGIC:  Alert and oriented x 3, non-focal PSYCHIATRIC:  Normal affect, good insight  ASSESSMENT:    1. Coronary artery disease involving native coronary artery of native heart without angina pectoris   2. Primary hypertension   3. Obesity (BMI 30-39.9)   4. Mixed hyperlipidemia    PLAN:     He appears to be euvolemic.  We will cut back on her diuretics to once a week as needed if the patient gain more than 5 pounds.  I have explained this to him he is very agreeable to proceed this way.  In terms of his symptoms for now with his recent COVID infection shared decision we will monitor the patient to see if he recovers without any residual symptoms if he continues to have progressive symptoms we will then proceed with ischemic evaluation.  Thankfully his echocardiogram did not show any evidence of valvular abnormalities or any cardiomyopathy.   The patient is in agreement with the above plan. The  patient left the office in stable condition.  The patient will follow up in 6 months with Dr. Bettina Gavia   Medication Adjustments/Labs and Tests Ordered: Current medicines are reviewed at length with the patient today.  Concerns regarding medicines are outlined above.  Orders Placed This Encounter  Procedures   Basic metabolic panel   Magnesium   Meds ordered this encounter  Medications   furosemide (LASIX) 40 MG tablet  Sig: Take 1 tablet (40 mg total) by mouth daily as needed for edema (weight gain greater than 5 pounds.).    Dispense:  90 tablet    Refill:  3    Patient Instructions  Medication Instructions:  Your physician has recommended you make the following change in your medication:   Take 20 mg Lasix daily as needed for swelling.  *If you need a refill on your cardiac medications before your next appointment, please call your pharmacy*   Lab Work: Your physician recommends that you have a BMET and magnesium level today.  If you have labs (blood work) drawn today and your tests are completely normal, you will receive your results only by: [place] (if you have MyChart) OR A paper copy in the mail If you have any lab test that is abnormal or we need to change your treatment, we will call you to review the results.   Testing/Procedures: None ordered   Follow-Up: At [facility], you and your health needs are our priority.  As part of our continuing mission to provide you with exceptional heart care, we have created designated Provider Care Teams.  These Care Teams include your primary Cardiologist (physician) and Advanced Practice Providers (APPs -  Physician Assistants and Nurse Practitioners) who all work together to provide you with the care you need, when you need it.  We recommend signing up for the patient portal called "MyChart".  Sign up information is provided on this After Visit Summary.  MyChart is used to connect with patients for Virtual Visits  (Telemedicine).  Patients are able to view lab/test results, encounter notes, upcoming appointments, etc.  Non-urgent messages can be sent to your provider as well.   To learn more about what you can do with MyChart, go to [email or web address].    Your next appointment:   6 month(s)  The format for your next appointment:   In Person  Provider:   Shirlee More, MD   Other Instructions NA    Adopting a Healthy Lifestyle.  Know what a healthy weight is for you (roughly BMI <25) and aim to maintain this   Aim for 7+ servings of fruits and vegetables daily   65-80+ fluid ounces of water or unsweet tea for healthy kidneys   Limit to max 1 drink of alcohol per day; avoid smoking/tobacco   Limit animal fats in diet for cholesterol and heart health - choose grass fed whenever available   Avoid highly processed foods, and foods high in saturated/trans fats   Aim for low stress - take time to unwind and care for your mental health   Aim for 150 min of moderate intensity exercise weekly for heart health, and weights twice weekly for bone health   Aim for 7-9 hours of sleep daily   When it comes to diets, agreement about the perfect plan isnt easy to find, even among the experts. Experts at the [employer] developed an idea known as the Healthy Eating Plate. Just imagine a plate divided into logical, healthy portions.   The emphasis is on diet quality:   Load up on vegetables and fruits - one-half of your plate: Aim for color and variety, and remember that potatoes dont count.   Go for whole grains - one-quarter of your plate: Whole wheat, barley, wheat berries, quinoa, oats, brown rice, and foods made with them. If you want pasta, go with whole wheat pasta.   Protein power - one-quarter of your  plate: Fish, chicken, beans, and nuts are all healthy, versatile protein sources. Limit red meat.   The diet, however, does go beyond the plate, offering a few other  suggestions.   Use healthy plant oils, such as olive, canola, soy, corn, sunflower and peanut. Check the labels, and avoid partially hydrogenated oil, which have unhealthy trans fats.   If youre thirsty, drink water. Coffee and tea are good in moderation, but skip sugary drinks and limit milk and dairy products to one or two daily servings.   The type of carbohydrate in the diet is more important than the amount. Some sources of carbohydrates, such as vegetables, fruits, whole grains, and beans-are healthier than others.   Finally, stay active  Signed, Berniece Salines, DO  10/01/2020 2:59 PM    [facility]

## 2020-10-01 NOTE — Patient Instructions (Signed)
Medication Instructions:  Your physician has recommended you make the following change in your medication:   Take 20 mg Lasix daily as needed for swelling.  *If you need a refill on your cardiac medications before your next appointment, please call your pharmacy*   Lab Work: Your physician recommends that you have a BMET and magnesium level today.  If you have labs (blood work) drawn today and your tests are completely normal, you will receive your results only by: [place] (if you have MyChart) OR A paper copy in the mail If you have any lab test that is abnormal or we need to change your treatment, we will call you to review the results.   Testing/Procedures: None ordered   Follow-Up: At [facility], you and your health needs are our priority.  As part of our continuing mission to provide you with exceptional heart care, we have created designated Provider Care Teams.  These Care Teams include your primary Cardiologist (physician) and Advanced Practice Providers (APPs -  Physician Assistants and Nurse Practitioners) who all work together to provide you with the care you need, when you need it.  We recommend signing up for the patient portal called "MyChart".  Sign up information is provided on this After Visit Summary.  MyChart is used to connect with patients for Virtual Visits (Telemedicine).  Patients are able to view lab/test results, encounter notes, upcoming appointments, etc.  Non-urgent messages can be sent to your provider as well.   To learn more about what you can do with MyChart, go to [email or web address].    Your next appointment:   6 month(s)  The format for your next appointment:   In Person  Provider:   Shirlee More, MD   Other Instructions NA

## 2020-10-02 LAB — BASIC METABOLIC PANEL
BUN/Creatinine Ratio: 22 (ref 10–24)
BUN: 19 mg/dL (ref 8–27)
CO2: 21 mmol/L (ref 20–29)
Calcium: 9.4 mg/dL (ref 8.6–10.2)
Chloride: 104 mmol/L (ref 96–106)
Creatinine, Ser: 0.86 mg/dL (ref 0.76–1.27)
Glucose: 113 mg/dL — ABNORMAL HIGH (ref 65–99)
Potassium: 4.9 mmol/L (ref 3.5–5.2)
Sodium: 138 mmol/L (ref 134–144)
eGFR: 89 mL/min/{1.73_m2} (ref >59)

## 2020-10-02 LAB — MAGNESIUM: Magnesium: 2 mg/dL (ref 1.6–2.3)

## 2020-10-30 DIAGNOSIS — R198 Other specified symptoms and signs involving the digestive system and abdomen: Secondary | ICD-10-CM

## 2020-10-30 HISTORY — DX: Other specified symptoms and signs involving the digestive system and abdomen: R19.8

## 2020-11-01 DIAGNOSIS — M795 Residual foreign body in soft tissue: Secondary | ICD-10-CM

## 2020-11-01 HISTORY — DX: Residual foreign body in soft tissue: M79.5

## 2021-04-24 ENCOUNTER — Other Ambulatory Visit: Payer: Self-pay

## 2021-04-25 ENCOUNTER — Other Ambulatory Visit: Payer: Self-pay

## 2021-04-25 ENCOUNTER — Encounter: Payer: Self-pay | Admitting: Cardiology

## 2021-04-25 ENCOUNTER — Ambulatory Visit (INDEPENDENT_AMBULATORY_CARE_PROVIDER_SITE_OTHER): Payer: Medicare Other | Admitting: Cardiology

## 2021-04-25 VITALS — BP 136/66 | HR 64 | Ht 70.6 in | Wt 208.6 lb

## 2021-04-25 DIAGNOSIS — I25118 Atherosclerotic heart disease of native coronary artery with other forms of angina pectoris: Secondary | ICD-10-CM | POA: Diagnosis not present

## 2021-04-25 DIAGNOSIS — I119 Hypertensive heart disease without heart failure: Secondary | ICD-10-CM

## 2021-04-25 DIAGNOSIS — E782 Mixed hyperlipidemia: Secondary | ICD-10-CM | POA: Diagnosis not present

## 2021-04-25 NOTE — Progress Notes (Signed)
?Cardiology Office Note:   ? ?Date:  04/25/2021  ? ?ID:  Billy Gallegos, DOB [birth year], MRN [id number] ? ?PCP:  Algis Greenhouse, MD  ?Cardiologist:  Shirlee More, MD   ? ?Referring MD: Algis Greenhouse, MD  ? ? ?ASSESSMENT:   ? ?1. Coronary artery disease of native artery of native heart with stable angina pectoris ([place])   ?2. Hypertensive heart disease, unspecified whether heart failure present   ?3. Mixed hyperlipidemia   ? ?PLAN:   ? ?In order of problems listed above: ? ?Xayvion continues to have a good long-term result from his CABG remains asymptomatic following bypass surgery 15 years ago on current medical treatment and will continue aspirin antihypertensives and combined lipid-lowering treatment.  His echocardiogram in the last year was normal ?Stable BP is at target of asked him to start to record his blood pressure several days a week with good technique bring to his next office visit ?Ideal lipids continues combined atorvastatin and Zetia ? ? ?Next appointment: 1 year ? ? ?Medication Adjustments/Labs and Tests Ordered: ?Current medicines are reviewed at length with the patient today.  Concerns regarding medicines are outlined above.  ?Orders Placed This Encounter  ?Procedures  ? EKG 12-Lead  ? ?No orders of the defined types were placed in this encounter. ? ? ?Chief Complaint  ?Patient presents with  ? Follow-up  ? Coronary Artery Disease  ? ? ?History of Present Illness:   ? ?Billy Gallegos is a 81 y.o. male with a hx of CAD with CABG 2008 hypertension type 2 diabetes and hyperlipidemia last seen 10/01/2020.  There is a notation that he had been short of breath placed on a diuretic although there is no diagnosis of heart failure.  An echocardiogram performed since 08/14/2020 with normal left ventricular size mild concentric LVH systolic diastolic function normal right ventricular size mildly reduced right ventricular systolic function and no valvular abnormality. ? ?Compliance with diet, lifestyle and  medications: Yes ? ?When he checks home blood pressure runs in the range of 130-135/60-65 repeat blood pressure is at target ?He continues to do well vigorous active no exercise intolerance edema shortness of breath chest pain palpitation or syncope. ?He tolerates his combined statin and Zetia without muscle pain or weakness ?He was admitted last winter to the hospital in [place] overnight for IV fluids when he had severe enteritis ? ?Recent labs [facility] 12/19/2020 ?Sodium 139 potassium 4.9 creatinine 1.01 ?Total cholesterol 107 LDL 54 ?Hemoglobin 13.5 ? ?Past Medical History:  ?Diagnosis Date  ? Allergic rhinitis 12/04/2015  ? Amaurosis fugax 05/20/2015  ? Formatting of this note might be different from the original. 2007  ? Anal symptoms 10/30/2020  ? Atopic dermatitis 12/04/2015  ? Benign hypertension 03/15/2015  ? 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 CAD (coronary artery disease)   ? CABG x5  ? Chronic right shoulder pain 01/14/2018  ? Formatting of this note might be different from the original. 2019  ? Coronary artery disease involving native coronary artery of native heart without angina pectoris 05/20/2015  ? Formatting of this note might be different from the original. 2006: 5V CABG 2012: Stress ECHO neg  ? COVID-19 virus infection 12/18/2019  ? Formatting of this note might be different from the original. 10/2019: mild, no MCA  ? Deviated nasal septum 12/04/2015  ? Enlarged prostate with lower urinary tract symptoms (LUTS) 05/20/2015  ? Fatigue 06/18/2017  ? Foreign body (FB) in soft tissue 11/01/2020  ? Formatting of  this note might be different from the original. 11/01/2020: left middle finger, excised  ? Gastrointestinal hemorrhage 05/20/2015  ? Gout 05/20/2015  ? Gynecomastia, male 02/23/2016  ? Formatting of this note might be different from the original. 2016: onset 2017: SURG eval, MMG, bilateral  ? History of chronic kidney disease 05/20/2015  ? History of hydrocele 02/21/2016  ? Formatting of this note  might be different from the original. 2016  ? Hospital discharge follow-up 09/01/2018  ? HTN (hypertension)   ? Hyperlipemia   ? Hyperlipidemia, mixed 05/20/2015  ? Lower abdominal pain 01/15/2020  ? Formatting of this note might be different from the original. 01/15/2020  ? Lumbar strain 08/24/2017  ? Formatting of this note might be different from the original. 2019: right  ? Mixed hyperlipidemia 02/02/2019  ? Muscle strain 11/16/2017  ? Formatting of this note might be different from the original. 2019: pectorali minor, right  ? Nocturia 06/26/2016  ? Obesity (BMI 30-39.9) 01/16/2020  ? Obstructive sleep apnea 08/11/2016  ? Formatting of this note might be different from the original. 2018: HST: RDI 15, low sat 79% Titration Study 09/28/16 determined CPAP 9CWP  ? 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 Formatting of this note might be different from the original. 2013  ? Pain in joint, lower leg 02/21/2016  ? Formatting of this note might be different from the original. 2016  ? Pre-operative clearance 02/02/2019  ? Prediabetes 05/21/2015  ? Formatting of this note might be different from the original. 2017: 127/5.7  ? S/P right knee arthroscopy 02/14/2019  ? Screening for prostate cancer 02/21/2016  ? 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 Sleep disturbance 06/26/2016  ? Formatting of this note might be different from the original. 2005: PSG mild changes  ? Tubular adenoma of colon 06/30/2019  ? Formatting of this note might be different from the original. 2021 :colonoscopy  ? Type 2 diabetes mellitus without complication, without long-term current use of insulin ([place]) 05/21/2015  ? Formatting of this note might be different from the original. 2017: 127/5.7 12/19/2020: 94/6.6  ? ? ?Past Surgical History:  ?Procedure Laterality Date  ? CORONARY ARTERY BYPASS GRAFT    ? x 4  ? HYDROCELE EXCISION    ? INGUINAL HERNIA REPAIR    ?  x2  ? KNEE SURGERY Right   ? SKIN CANCER EXCISION    ? ? ?Current Medications: ?Current  Meds  ?Medication Sig  ? allopurinol (ZYLOPRIM) 100 MG tablet Take 200 mg by mouth daily.  ? amLODipine (NORVASC) 5 MG tablet Take 5 mg by mouth daily.  ? aspirin 81 MG EC tablet Take 81 mg by mouth daily.  ? atorvastatin (LIPITOR) 80 MG tablet Take 80 mg by mouth daily.  ? colchicine 0.6 MG tablet Take 0.6 mg by mouth as needed (gout).  ? ezetimibe (ZETIA) 10 MG tablet Take 10 mg by mouth daily.  ? irbesartan (AVAPRO) 300 MG tablet Take 300 mg by mouth every morning.  ? sildenafil (REVATIO) 20 MG tablet Take 20 mg by mouth as needed (erectile dysfunction).  ? tamsulosin (FLOMAX) 0.4 MG CAPS capsule Take 0.4 mg by mouth daily.  ?  ? ?Allergies:   Oseltamivir, Penicillin g, Lisinopril, Metoprolol tartrate, and Niacin  ? ?Social History  ? ?Socioeconomic History  ? Marital status: Married  ?  Spouse name: Not on file  ? Number of children: Not on file  ? Years of education: Not on file  ? Highest  education level: Not on file  ?Occupational History  ? Not on file  ?Tobacco Use  ? Smoking status: Former  ?  Types: Cigarettes  ?  Quit date: 93  ?  Years since quitting: 42.2  ? Smokeless tobacco: Never  ?Vaping Use  ? Vaping Use: Never used  ?Substance and Sexual Activity  ? Alcohol use: Not Currently  ?  Comment: occasionally  ? Drug use: Never  ? Sexual activity: Not on file  ?Other Topics Concern  ? Not on file  ?Social History Narrative  ? Not on file  ? ?Social Determinants of Health  ? ?Financial Resource Strain: Not on file  ?Food Insecurity: Not on file  ?Transportation Needs: Not on file  ?Physical Activity: Not on file  ?Stress: Not on file  ?Social Connections: Not on file  ?  ? ?Family History: ?The patient's family history includes CAD in his mother; Emphysema in his father; Heart attack in his mother; Hypertension in his sister; Prostate cancer in his brother; Skin cancer in his sister. ?ROS:   ?Please see the history of present illness.    ?All other systems reviewed and are negative. ? ?EKGs/Labs/Other  Studies Reviewed:   ? ?The following studies were reviewed today: ? ?EKG:  EKG ordered today and personally reviewed.  The ekg ordered today demonstrates sinus rhythm remains normal ? ?Transthoracic echocardiogra

## 2021-04-25 NOTE — Patient Instructions (Signed)
Medication Instructions:  ?Your physician recommends that you continue on your current medications as directed. Please refer to the Current Medication list given to you today. ? ?*If you need a refill on your cardiac medications before your next appointment, please call your pharmacy* ? ? ?Lab Work: ?NONE ?If you have labs (blood work) drawn today and your tests are completely normal, you will receive your results only by: ?MyChart Message (if you have MyChart) OR ?A paper copy in the mail ?If you have any lab test that is abnormal or we need to change your treatment, we will call you to review the results. ? ? ?Testing/Procedures: ?NONE ? ? ?Follow-Up: ?At [facility], you and your health needs are our priority.  As part of our continuing mission to provide you with exceptional heart care, we have created designated Provider Care Teams.  These Care Teams include your primary Cardiologist (physician) and Advanced Practice Providers (APPs -  Physician Assistants and Nurse Practitioners) who all work together to provide you with the care you need, when you need it. ? ?We recommend signing up for the patient portal called "MyChart".  Sign up information is provided on this After Visit Summary.  MyChart is used to connect with patients for Virtual Visits (Telemedicine).  Patients are able to view lab/test results, encounter notes, upcoming appointments, etc.  Non-urgent messages can be sent to your provider as well.   ?To learn more about what you can do with MyChart, go to [email or web address].   ? ?Your next appointment:   ?1 year(s) ? ?The format for your next appointment:   ?In Person ? ?Provider:   ?Brian Munley, MD  ? ? ?Other Instructions ?  ?

## 2021-07-14 DIAGNOSIS — J189 Pneumonia, unspecified organism: Secondary | ICD-10-CM | POA: Insufficient documentation

## 2021-07-14 HISTORY — DX: Pneumonia, unspecified organism: J18.9

## 2021-08-25 DIAGNOSIS — B9789 Other viral agents as the cause of diseases classified elsewhere: Secondary | ICD-10-CM | POA: Insufficient documentation

## 2021-08-25 HISTORY — DX: Other viral agents as the cause of diseases classified elsewhere: B97.89

## 2021-10-12 ENCOUNTER — Other Ambulatory Visit: Payer: Self-pay | Admitting: Cardiology

## 2022-01-16 DIAGNOSIS — M79671 Pain in right foot: Secondary | ICD-10-CM

## 2022-01-16 HISTORY — DX: Pain in right foot: M79.671

## 2022-07-02 DIAGNOSIS — H04301 Unspecified dacryocystitis of right lacrimal passage: Secondary | ICD-10-CM | POA: Insufficient documentation

## 2022-07-02 HISTORY — DX: Unspecified dacryocystitis of right lacrimal passage: H04.301

## 2022-09-03 DIAGNOSIS — I10 Essential (primary) hypertension: Secondary | ICD-10-CM | POA: Insufficient documentation

## 2022-09-03 NOTE — Progress Notes (Signed)
Cardiology Office Note:    Date:  09/04/2022   ID:  Billy Gallegos, DOB 1940-08-13, MRN [id number]  PCP:  Olive Bass, MD  Cardiologist:  Norman Herrlich, MD    Referring MD: Olive Bass, MD    ASSESSMENT:    1. Coronary artery disease of native artery of native heart with stable angina pectoris (HCC)   2. Hypertensive heart disease without heart failure   3. Mixed hyperlipidemia    PLAN:    In order of problems listed above:  Billy Gallegos continues to do well with CAD he is quite active has no exercise intolerance or symptoms of angina and will continue his medical therapy including aspirin antihypertensives and combined Zetia and atorvastatin with a good lipid profile. Well-controlled on his current combination including calcium channel blocker ARB Continue combined lipid lowering statin and Zetia   Next appointment: 1 year   Medication Adjustments/Labs and Tests Ordered: Current medicines are reviewed at length with the patient today.  Concerns regarding medicines are outlined above.  Orders Placed This Encounter  Procedures   EKG 12-Lead   No orders of the defined types were placed in this encounter.    History of Present Illness:    Billy Gallegos is a 81 y.o. male with a hx of CAD with CABG 2008 hypertension type 2 diabetes and hyperlipidemia  last seen 04/05/2021.  Lab 07/21/2022 cholesterol 109 LDL 51 triglycerides 66 HDL 43 he had a CMP performed random glucose was 108 potassium 4.4 sodium 138 creatinine 0.92 with normal liver function test  Compliance with diet, lifestyle and medications: Yes  Billy Gallegos has no cardiovascular complaints related to CAD not having chest pain no exercise intolerance edema shortness of breath palpitation or syncope He tells me there is a concern about taking high intensity statin I suspect it is related to age and after discussing he would like to remain on his current a atorvastatin and Zetia He is not having muscle pain or  weakness. Past Medical History:  Diagnosis Date   Acute laryngotracheitis 04/23/2017   2019  07/09/2021     Allergic rhinitis 12/04/2015   Amaurosis fugax 05/20/2015   Formatting of this note might be different from the original. 2007   Anal symptoms 10/30/2020   Atopic dermatitis 12/04/2015   Benign hypertension 03/15/2015   Bilateral tinnitus 06/28/2018   CAD (coronary artery disease)    CABG x5   Chronic right shoulder pain 01/14/2018   Formatting of this note might be different from the original. 2019   Community acquired pneumonia 07/14/2021   07/14/2021 : BLL, atypical     Coronary artery disease involving native coronary artery of native heart without angina pectoris 05/20/2015   Formatting of this note might be different from the original. 2006: 5V CABG 2012: Stress ECHO neg   COVID-19 virus infection 12/18/2019   Formatting of this note might be different from the original. 10/2019: mild, no MCA   Dacryocystitis of right lacrimal sac 07/02/2022   07/02/2022 : right     Deviated nasal septum 12/04/2015   Enlarged prostate with lower urinary tract symptoms (LUTS) 05/20/2015   Fatigue 06/18/2017   Foreign body (FB) in soft tissue 11/01/2020   Formatting of this note might be different from the original. 11/01/2020: left middle finger, excised   Gastrointestinal hemorrhage 05/20/2015   Gout 05/20/2015   Gynecomastia, male 02/23/2016   Formatting of this note might be different from the original. 2016: onset 2017: SURG eval, MMG, bilateral  History of chronic kidney disease 05/20/2015   History of hydrocele 02/21/2016   Formatting of this note might be different from the original. 2016   Hospital discharge follow-up 09/01/2018   HTN (hypertension)    Hyperlipemia    Hyperlipidemia, mixed 05/20/2015   Hypertensive heart disease    Lower abdominal pain 01/15/2020   Formatting of this note might be different from the original. 01/15/2020   Lumbar strain 08/24/2017   Formatting  of this note might be different from the original. 2019: right   Mixed hyperlipidemia 02/02/2019   Muscle strain 11/16/2017   Formatting of this note might be different from the original. 2019: pectorali minor, right   Nocturia 06/26/2016   Obesity (BMI 30-39.9) 01/16/2020   Obstructive sleep apnea 08/11/2016   Formatting of this note might be different from the original. 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 2013   Pain in joint, lower leg 02/21/2016   Formatting of this note might be different from the original. 2016   Pre-operative clearance 02/02/2019   Prediabetes 05/21/2015   Formatting of this note might be different from the original. 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 2005: PSG mild changes   Tubular adenoma of colon 06/30/2019   Formatting of this note might be different from the original. 2021 :colonoscopy   Type 2 diabetes mellitus without complication, without long-term current use of insulin (HCC) 05/21/2015   Formatting of this note might be different from the original. 2017: 127/5.7 12/19/2020: 94/6.6   Viral respiratory infection 08/25/2021   08/25/2021      Current Medications: Current Meds  Medication Sig   allopurinol (ZYLOPRIM) 100 MG tablet Take 200 mg by mouth daily.   amLODipine (NORVASC) 5 MG tablet Take 5 mg by mouth daily.   aspirin 81 MG EC tablet Take 81 mg by mouth daily.   atorvastatin (LIPITOR) 80 MG tablet Take 80 mg by mouth daily.   colchicine 0.6 MG tablet Take 0.6 mg by mouth as needed (gout).   ezetimibe (ZETIA) 10 MG tablet Take 10 mg by mouth daily.    irbesartan (AVAPRO) 300 MG tablet Take 300 mg by mouth every morning.   sildenafil (REVATIO) 20 MG tablet Take 20 mg by mouth as needed (erectile dysfunction).   tamsulosin (FLOMAX) 0.4 MG CAPS capsule Take 0.4 mg by mouth daily.      EKGs/Labs/Other Studies Reviewed:    The following studies were reviewed today:  Cardiac Studies & Procedures       ECHOCARDIOGRAM  ECHOCARDIOGRAM COMPLETE 08/14/2020  Narrative ECHOCARDIOGRAM REPORT    Patient Name:   DILLIAN FEIG [place] Date of Exam: 08/14/2020 Medical Rec #:  [id number]      Height:       71.0 in Accession #:    [id number]     Weight:       219.0 lb Date of Birth:  [birth year]      BSA:          2.191 m Patient Age:    79 years       BP:  134/82 mmHg Patient Gender: M              HR:           55 bpm. Exam Location:  [facility]  Procedure: 2D Echo  Indications:    SOB (shortness of breath) [R06.02 (ICD-10-CM)]  History:        Patient has prior history of Echocardiogram examinations, most recent 05/17/2019.  Sonographer:    Louie Boston Referring Phys: [id number] KARDIE TOBB   Sonographer Comments: Technically difficult study due to poor echo windows. IMPRESSIONS   1. Left ventricular ejection fraction, by estimation, is 55 to 60%. The left ventricle has normal function. Left ventricular endocardial border not optimally defined to evaluate regional wall motion. There is mild concentric left ventricular hypertrophy. Left ventricular diastolic parameters were normal. 2. Right ventricular systolic function is mildly reduced. The right ventricular size is normal. 3. The mitral valve is normal in structure. No evidence of mitral valve regurgitation. No evidence of mitral stenosis. 4. The aortic valve is tricuspid. Aortic valve regurgitation is not visualized. No aortic stenosis is present. 5. There is mild dilatation of the ascending aorta, measuring 38 mm. 6. The inferior vena cava is normal in size with greater than 50%  respiratory variability, suggesting right atrial pressure of 3 mmHg.  FINDINGS Left Ventricle: Left ventricular ejection fraction, by estimation, is 55 to 60%. The left ventricle has normal function. Left ventricular endocardial border not optimally defined to evaluate regional wall motion. The left ventricular internal cavity size was normal in size. There is mild concentric left ventricular hypertrophy. Left ventricular diastolic parameters were normal. Normal left ventricular filling pressure.  Right Ventricle: The right ventricular size is normal. No increase in right ventricular wall thickness. Right ventricular systolic function is mildly reduced.  Left Atrium: Left atrial size was normal in size.  Right Atrium: Right atrial size was normal in size.  Pericardium: There is no evidence of pericardial effusion.  Mitral Valve: The mitral valve is normal in structure. No evidence of mitral valve regurgitation. No evidence of mitral valve stenosis.  Tricuspid Valve: The tricuspid valve is normal in structure. Tricuspid valve regurgitation is not demonstrated. No evidence of tricuspid stenosis.  Aortic Valve: The aortic valve is tricuspid. Aortic valve regurgitation is not visualized. No aortic stenosis is present.  Pulmonic Valve: The pulmonic valve was normal in structure. Pulmonic valve regurgitation is not visualized. No evidence of pulmonic stenosis.  Aorta: The aortic root is normal in size and structure and the aortic arch was not well visualized. There is mild dilatation of the ascending aorta, measuring 38 mm.  Venous: The pulmonary veins were not well visualized. The inferior vena cava is normal in size with greater than 50% respiratory variability, suggesting right atrial pressure of 3 mmHg.  IAS/Shunts: No atrial level shunt detected by color flow Doppler.   LEFT VENTRICLE PLAX 2D LVIDd:         5.30 cm Diastology LVIDs:         3.60 cm LV e' medial:    7.72 cm/s LV PW:          1.10 cm LV E/e' medial:  13.1 LV IVS:        1.10 cm LV e' lateral:   10.20 cm/s LV E/e' lateral: 9.9   RIGHT VENTRICLE            IVC RV S prime:     8.70 cm/s  IVC diam: 1.10 cm TAPSE (M-mode): 1.8 cm  LEFT ATRIUM             Index       RIGHT ATRIUM           Index LA diam:        3.80 cm 1.73 cm/m  RA Area:     13.50 cm LA Vol (A2C):   42.6 ml 19.44 ml/m RA Volume:   28.90 ml  13.19 ml/m LA Vol (A4C):   30.5 ml 13.92 ml/m LA Biplane Vol: 37.4 ml 17.07 ml/m AORTIC VALVE LVOT Vmax:   91.70 cm/s LVOT Vmean:  55.200 cm/s LVOT VTI:    0.190 m  AORTA Ao Root diam: 3.50 cm Ao Asc diam:  3.80 cm Ao Desc diam: 2.30 cm  MITRAL VALVE MV Area (PHT): 3.23 cm     SHUNTS MV Decel Time: 235 msec     Systemic VTI: 0.19 m MV E velocity: 101.00 cm/s MV A velocity: 87.00 cm/s MV E/A ratio:  1.16  Norman Herrlich MD Electronically signed by Norman Herrlich MD Signature Date/Time: 08/14/2020/12:44:04 PM    Final             EKG Interpretation Date/Time:  Friday September 04 2022 08:11:52 EDT Ventricular Rate:  57 PR Interval:  202 QRS Duration:  82 QT Interval:  422 QTC Calculation: 410 R Axis:   49  Text Interpretation: Sinus bradycardia When compared with ECG of 14-Jan-2005 02:46, Non-specific change in ST segment in Inferior leads Confirmed by Norman Herrlich ([id number]) on 09/04/2022 8:34:36 AM  EKG Interpretation Date/Time:  Friday September 04 2022 08:11:52 EDT Ventricular Rate:  57 PR Interval:  202 QRS Duration:  82 QT Interval:  422 QTC Calculation: 410 R Axis:   49  Text Interpretation: Sinus bradycardia When compared with ECG of 14-Jan-2005 02:46, Non-specific change in ST segment in Inferior leads Confirmed by Norman Herrlich ([id number]) on 09/04/2022 8:34:36 AM   Recent Labs: No results found for requested labs within last 365 days.  Recent Lipid Panel No results found for: "CHOL", "TRIG", "HDL", "CHOLHDL", "VLDL", "LDLCALC", "LDLDIRECT"  Physical Exam:    VS:  BP  120/60 (BP Location: Left Arm, Patient Position: Sitting, Cuff Size: Normal)   Pulse 60   Ht 5\' 11"  (1.803 m)   Wt 208 lb (94.3 kg)   SpO2 98%   BMI 29.01 kg/m     Wt Readings from Last 3 Encounters:  09/04/22 208 lb (94.3 kg)  04/25/21 208 lb 9.6 oz (94.6 kg)  10/01/20 221 lb (100.2 kg)     GEN:  Well nourished, well developed in no acute distress HEENT: Normal NECK: No JVD; No carotid bruits LYMPHATICS: No lymphadenopathy CARDIAC: RRR, no murmurs, rubs, gallops RESPIRATORY:  Clear to auscultation without rales, wheezing or rhonchi  ABDOMEN: Soft, non-tender, non-distended MUSCULOSKELETAL:  No edema; No deformity  SKIN: Warm and dry NEUROLOGIC:  Alert and oriented x 3 PSYCHIATRIC:  Normal affect    Signed, Norman Herrlich, MD  09/04/2022 10:48 AM    [facility]

## 2022-09-04 ENCOUNTER — Ambulatory Visit: Payer: Medicare Other | Admitting: Cardiology

## 2022-09-04 ENCOUNTER — Encounter: Payer: Self-pay | Admitting: Cardiology

## 2022-09-04 VITALS — BP 120/60 | HR 60 | Ht 71.0 in | Wt 208.0 lb

## 2022-09-04 DIAGNOSIS — I119 Hypertensive heart disease without heart failure: Secondary | ICD-10-CM

## 2022-09-04 DIAGNOSIS — I25118 Atherosclerotic heart disease of native coronary artery with other forms of angina pectoris: Secondary | ICD-10-CM | POA: Diagnosis not present

## 2022-09-04 DIAGNOSIS — E782 Mixed hyperlipidemia: Secondary | ICD-10-CM

## 2022-09-04 NOTE — Patient Instructions (Signed)
Medication Instructions:  Your physician recommends that you continue on your current medications as directed. Please refer to the Current Medication list given to you today.  *If you need a refill on your cardiac medications before your next appointment, please call your pharmacy*   Lab Work: None If you have labs (blood work) drawn today and your tests are completely normal, you will receive your results only by: MyChart Message (if you have MyChart) OR A paper copy in the mail If you have any lab test that is abnormal or we need to change your treatment, we will call you to review the results.   Testing/Procedures: None   Follow-Up: At [facility], you and your health needs are our priority.  As part of our continuing mission to provide you with exceptional heart care, we have created designated Provider Care Teams.  These Care Teams include your primary Cardiologist (physician) and Advanced Practice Providers (APPs -  Physician Assistants and Nurse Practitioners) who all work together to provide you with the care you need, when you need it.  We recommend signing up for the patient portal called "MyChart".  Sign up information is provided on this After Visit Summary.  MyChart is used to connect with patients for Virtual Visits (Telemedicine).  Patients are able to view lab/test results, encounter notes, upcoming appointments, etc.  Non-urgent messages can be sent to your provider as well.   To learn more about what you can do with MyChart, go to [email or web address].    Your next appointment:   1 year(s)  Provider:   Brian Munley, MD    Other Instructions None  

## 2022-09-22 DIAGNOSIS — S76212A Strain of adductor muscle, fascia and tendon of left thigh, initial encounter: Secondary | ICD-10-CM | POA: Insufficient documentation

## 2022-09-22 HISTORY — DX: Strain of adductor muscle, fascia and tendon of left thigh, initial encounter: S76.212A

## 2022-12-03 DIAGNOSIS — R17 Unspecified jaundice: Secondary | ICD-10-CM | POA: Insufficient documentation

## 2022-12-03 DIAGNOSIS — D649 Anemia, unspecified: Secondary | ICD-10-CM | POA: Insufficient documentation

## 2022-12-03 DIAGNOSIS — E778 Other disorders of glycoprotein metabolism: Secondary | ICD-10-CM | POA: Insufficient documentation

## 2022-12-03 HISTORY — DX: Unspecified jaundice: R17

## 2022-12-25 DIAGNOSIS — B9689 Other specified bacterial agents as the cause of diseases classified elsewhere: Secondary | ICD-10-CM | POA: Insufficient documentation

## 2022-12-25 HISTORY — DX: Other specified bacterial agents as the cause of diseases classified elsewhere: B96.89

## 2023-02-03 DIAGNOSIS — R052 Subacute cough: Secondary | ICD-10-CM

## 2023-02-03 HISTORY — DX: Subacute cough: R05.2

## 2023-02-08 DIAGNOSIS — J45991 Cough variant asthma: Secondary | ICD-10-CM

## 2023-02-08 HISTORY — DX: Cough variant asthma: J45.991

## 2023-03-03 DIAGNOSIS — M19011 Primary osteoarthritis, right shoulder: Secondary | ICD-10-CM

## 2023-03-03 HISTORY — DX: Primary osteoarthritis, right shoulder: M19.011

## 2023-08-25 ENCOUNTER — Ambulatory Visit: Admitting: Cardiology

## 2023-08-26 ENCOUNTER — Ambulatory Visit

## 2023-08-26 VITALS — BP 118/74 | HR 55 | Ht 70.5 in | Wt 221.0 lb

## 2023-08-26 DIAGNOSIS — E782 Mixed hyperlipidemia: Secondary | ICD-10-CM

## 2023-08-26 DIAGNOSIS — R600 Localized edema: Secondary | ICD-10-CM | POA: Diagnosis not present

## 2023-08-26 DIAGNOSIS — I119 Hypertensive heart disease without heart failure: Secondary | ICD-10-CM

## 2023-08-26 DIAGNOSIS — I2510 Atherosclerotic heart disease of native coronary artery without angina pectoris: Secondary | ICD-10-CM | POA: Diagnosis not present

## 2023-08-26 MED ORDER — FUROSEMIDE 20 MG PO TABS: 20.0000 mg | ORAL_TABLET | Freq: Every day | ORAL | 3 refills | Status: AC

## 2023-08-26 NOTE — Patient Instructions (Addendum)
 Medication Instructions:   START: Lasix  20mg  1 tablet every other day for 2 weeks then as needed for weight gain of 2-3 lbs in 1 day of 5 lbs in 1 week   Lab Work: CMP, MGM, ProBNP- today If you have labs (blood work) drawn today and your tests are completely normal, you will receive your results only by: MyChart Message (if you have MyChart) OR A paper copy in the mail If you have any lab test that is abnormal or we need to change your treatment, we will call you to review the results.   Testing/Procedures: Your physician has requested that you have an echocardiogram. Echocardiography is a painless test that uses sound waves to create images of your heart. It provides your doctor with information about the size and shape of your heart and how well your heart's chambers and valves are working. This procedure takes approximately one hour. There are no restrictions for this procedure. Please do NOT wear cologne, perfume, aftershave, or lotions (deodorant is allowed). Please arrive 15 minutes prior to your appointment time.  Please note: We ask at that you not bring children with you during ultrasound (echo/ vascular) testing. Due to room size and safety concerns, children are not allowed in the ultrasound rooms during exams. Our front office staff cannot provide observation of children in our lobby area while testing is being conducted. An adult accompanying a patient to their appointment will only be allowed in the ultrasound room at the discretion of the ultrasound technician under special circumstances. We apologize for any inconvenience.    Follow-Up: At [facility], you and your health needs are our priority.  As part of our continuing mission to provide you with exceptional heart care, we have created designated Provider Care Teams.  These Care Teams include your primary Cardiologist (physician) and Advanced Practice Providers (APPs -  Physician Assistants and Nurse Practitioners) who  all work together to provide you with the care you need, when you need it.  We recommend signing up for the patient portal called MyChart.  Sign up information is provided on this After Visit Summary.  MyChart is used to connect with patients for Virtual Visits (Telemedicine).  Patients are able to view lab/test results, encounter notes, upcoming appointments, etc.  Non-urgent messages can be sent to your provider as well.   To learn more about what you can do with MyChart, go to [email or web address].    Your next appointment:   3 month(s)  The format for your next appointment:   In Person  Provider:   Alean Kobus, MD   Other Instructions NA

## 2023-08-26 NOTE — Assessment & Plan Note (Signed)
 Well-controlled lipid panel from 07/23/2023. Continue with atorvastatin 80 mg and Zetia 10 mg once daily.

## 2023-08-26 NOTE — Assessment & Plan Note (Signed)
 No symptoms of angina. Not regularly exercising. Continue activity as tolerated and increase with regular exercise. Continue aspirin 81 mg once daily. Continue atorvastatin 80 mg and Zetia 10 mg once daily.

## 2023-08-26 NOTE — Assessment & Plan Note (Signed)
 Blood pressure well-controlled. Continue amlodipine 5 mg once daily and irbesartan 300 mg once daily.  In the setting of longstanding history of hypertension and prior history of CAD now with pedal edema, would evaluate cardiac structure and function once again with a repeat echocardiogram.

## 2023-08-26 NOTE — Assessment & Plan Note (Signed)
 Bilateral lower extremity edema over the last 3 to 4 weeks, pitting. Likely related to diastolic dysfunction and fluid overload.  Will obtain CMP, magnesium, proBNP Echocardiogram to rule out diastolic dysfunction and any significant LV dysfunction.  Start low-dose Lasix  20 mg every other day for the next 2 weeks after that use it as needed for any weight gain over 2 to 3 pounds in a day or 4 to 5 pounds in a week.

## 2023-08-26 NOTE — Progress Notes (Signed)
 Cardiology Consultation:    Date:  08/26/2023   ID:  LUCAN RINER, DOB Apr 18, 1940, MRN [id number]  PCP:  Ofilia Lamar CROME, MD  Cardiologist:  Alean SAUNDERS Ashly Goethe, MD   Referring MD: Ofilia Lamar CROME, MD   Chief Complaint  Patient presents with   Joint Swelling     ASSESSMENT AND PLAN:   Mr. Korinek 83 year old male with history of CAD s/p CABG in 2008, diabetes mellitus, hypertension, hyperlipidemia, OSA, last echocardiogram July 2022 with LVEF 55 to 60%, normal diastolic function, no significant valve abnormalities, ascending aorta mildly dilated 3.8 cm.    Problem List Items Addressed This Visit     Mixed hyperlipidemia   Well-controlled lipid panel from 07/23/2023. Continue with atorvastatin 80 mg and Zetia 10 mg once daily.       Relevant Medications   tadalafil (CIALIS) 5 MG tablet   furosemide  (LASIX ) 20 MG tablet   CAD (coronary artery disease) - Primary   No symptoms of angina. Not regularly exercising. Continue activity as tolerated and increase with regular exercise. Continue aspirin 81 mg once daily. Continue atorvastatin 80 mg and Zetia 10 mg once daily.      Relevant Medications   tadalafil (CIALIS) 5 MG tablet   furosemide  (LASIX ) 20 MG tablet   Hypertensive heart disease   Blood pressure well-controlled. Continue amlodipine 5 mg once daily and irbesartan 300 mg once daily.  In the setting of longstanding history of hypertension and prior history of CAD now with pedal edema, would evaluate cardiac structure and function once again with a repeat echocardiogram.       Relevant Medications   tadalafil (CIALIS) 5 MG tablet   furosemide  (LASIX ) 20 MG tablet   Other Relevant Orders   Comprehensive metabolic panel with GFR   Pro b natriuretic peptide (BNP)   Magnesium   ECHOCARDIOGRAM COMPLETE   Bilateral lower extremity edema   Bilateral lower extremity edema over the last 3 to 4 weeks, pitting. Likely related to diastolic dysfunction and fluid  overload.  Will obtain CMP, magnesium, proBNP Echocardiogram to rule out diastolic dysfunction and any significant LV dysfunction.  Start low-dose Lasix  20 mg every other day for the next 2 weeks after that use it as needed for any weight gain over 2 to 3 pounds in a day or 4 to 5 pounds in a week.      Relevant Medications   furosemide  (LASIX ) 20 MG tablet   Other Relevant Orders   Comprehensive metabolic panel with GFR   Pro b natriuretic peptide (BNP)   Magnesium   ECHOCARDIOGRAM COMPLETE    Return to clinic in 3 months.  History of Present Illness:    DANNIS DEROCHE is a 83 y.o. male who is being seen today fo for follow-up visit. Last visit with us  in the office was 09/04/2022 with Dr. Monetta.   PCP is Dough, Lamar CROME, MD.   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  Pleasant man here for the visit by himself.  Lives with his wife at home. Mentions keeps himself busy at home with various activities and lawn maintenance.  Has not been exercising routinely or walking regularly.  Mentions since November 2024 he has had a mild persistent cough after a viral infection, and the cough symptoms resolved in January 2025.  He has  had shoulder surgery completed and undergoing rehab.  Mentions yesterday while walking had a misstep on the stairs injuring his right foot great toe which is bruised up but able to move and walk.  He is following up in this regard with his PCP.  He has been dealing with symptoms of bilateral lower extremity edema for the last 3 to 4 weeks which he mentions has not increased greatly but has been present constantly.  In the past when he had this Lasix  helped him.  Denies any chest pain, shortness of breath, palpitations, lightheadedness, dizziness or syncopal  episodes. No blood in urine or stools.  Good compliance with his medications. Mentions diet typically does not have high sodium content.   Lipid panel from 07/23/2023 noted total cholesterol 87, triglycerides 77, HDL 29 and LDL 42.  Well-controlled. Hemoglobin A1c 6  on 05-05-2023.  Past Medical History:  Diagnosis Date   Acute laryngotracheitis 04/23/2017   2019  07/09/2021     Allergic rhinitis 12/04/2015   Amaurosis fugax 05/20/2015   Formatting of this note might be different from the original. 2007   Anal symptoms 10/30/2020   Atopic dermatitis 12/04/2015   Benign hypertension 03/15/2015   Bilateral tinnitus 06/28/2018   CAD (coronary artery disease)    CABG x5   Chronic right shoulder pain 01/14/2018   Formatting of this note might be different from the original. 2019   Community acquired pneumonia 07/14/2021   07/14/2021 : BLL, atypical     Coronary artery disease involving native coronary artery of native heart without angina pectoris 05/20/2015   Formatting of this note might be different from the original. 2006: 5V CABG 2012: Stress ECHO neg   COVID-19 virus infection 12/18/2019   Formatting of this note might be different from the original. 10/2019: mild, no MCA   Dacryocystitis of right lacrimal sac 07/02/2022   07/02/2022 : right     Deviated nasal septum 12/04/2015   Enlarged prostate with lower urinary tract symptoms (LUTS) 05/20/2015   Fatigue 06/18/2017   Foreign body (FB) in soft tissue 11/01/2020   Formatting of this note might be different from the original. 11/01/2020: left middle finger, excised   Gastrointestinal hemorrhage 05/20/2015   Gout 05/20/2015   Gynecomastia, male 02/23/2016   Formatting of this note might be different from the original. 2016: onset 2017: SURG eval, MMG, bilateral   History of chronic kidney disease 05/20/2015   History of hydrocele 02/21/2016   Formatting of this note might be different from the original. 2016   Hospital discharge  follow-up 09/01/2018   HTN (hypertension)    Hyperlipemia    Hyperlipidemia, mixed 05/20/2015   Hypertensive heart disease    Lower abdominal pain 01/15/2020   Formatting of this note might be different from the original. 01/15/2020   Lumbar strain 08/24/2017   Formatting of this note might be different from the original. 2019: right   Mixed hyperlipidemia 02/02/2019   Muscle strain 11/16/2017   Formatting of this note might be different from the original. 2019: pectorali minor, right   Nocturia 06/26/2016   Obesity (BMI 30-39.9) 01/16/2020   Obstructive sleep apnea 08/11/2016   Formatting of this note might be different from the original. 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 2013   Pain in joint, lower leg 02/21/2016   Formatting of this note might be  different from the original. 2016   Pre-operative clearance 02/02/2019   Prediabetes 05/21/2015   Formatting of this note might be different from the original. 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 2005: PSG mild changes   Tubular adenoma of colon 06/30/2019   Formatting of this note might be different from the original. 2021 :colonoscopy   Type 2 diabetes mellitus without complication, without long-term current use of insulin (HCC) 05/21/2015   Formatting of this note might be different from the original. 2017: 127/5.7 12/19/2020: 94/6.6   Viral respiratory infection 08/25/2021   08/25/2021      Past Surgical History:  Procedure Laterality Date   CORONARY ARTERY BYPASS GRAFT     x 4   HYDROCELE EXCISION     INGUINAL HERNIA REPAIR      x2    KNEE SURGERY Right    SKIN CANCER EXCISION     TOTAL SHOULDER REPLACEMENT Right     Current Medications: Current Meds  Medication Sig   allopurinol (ZYLOPRIM) 100 MG tablet Take 200 mg by mouth daily.   amLODipine (NORVASC) 5 MG tablet Take 5 mg by mouth daily.   aspirin 81 MG EC tablet Take 81 mg by mouth daily.   atorvastatin (LIPITOR) 80 MG tablet Take 80 mg by mouth daily.   colchicine 0.6 MG tablet Take 0.6 mg by mouth as needed (gout).   ezetimibe (ZETIA) 10 MG tablet Take 10 mg by mouth daily.   furosemide  (LASIX ) 20 MG tablet Take 1 tablet (20 mg total) by mouth daily. For 2 weeks then PRN weight gain of 2-3 lbs in 1 day or 5 lbs in 1 week   irbesartan (AVAPRO) 300 MG tablet Take 300 mg by mouth every morning.   tadalafil (CIALIS) 5 MG tablet Take 5 mg by mouth daily.   tamsulosin (FLOMAX) 0.4 MG CAPS capsule Take 0.4 mg by mouth daily.   [DISCONTINUED] sildenafil (REVATIO) 20 MG tablet Take 20 mg by mouth as needed (erectile dysfunction).     Allergies:   Oseltamivir, Penicillin g, Lisinopril, Metoprolol tartrate, Niacin, and Alfuzosin   Social History   Socioeconomic History   Marital status: Married    Spouse name: Not on file   Number of children: Not on file   Years of education: Not on file   Highest education level: Not on file  Occupational History   Not on file  Tobacco Use   Smoking status: Former    Current packs/day: 0.00    Types: Cigarettes    Quit date: 77    Years since quitting: 44.5   Smokeless tobacco: Never  Vaping Use   Vaping status: Never Used  Substance and Sexual Activity   Alcohol use: Not Currently    Comment: occasionally   Drug use: Never   Sexual activity: Not on file  Other Topics Concern   Not on file  Social History Narrative   Not on file   Social Drivers of Health   Financial Resource Strain: Not on file  Food Insecurity: Low Risk  (07/23/2023)   Received from [facility]   Hunger Vital Sign    Within the past 12  months, you worried that your food would run out  before you got money to buy more: Never true    Within the past 12 months, the food you bought just didn't last and you didn't have money to get more. : Never true  Transportation Needs: No Transportation Needs (07/23/2023)   Received from [facility]    In the past 12 months, has lack of reliable transportation kept you from medical appointments, meetings, work or from getting things needed for daily living? : No  Physical Activity: Not on file  Stress: Not on file  Social Connections: Not on file     Family History: The patient's family history includes CAD in his mother; Emphysema in his father; Heart attack in his mother; Hypertension in his sister; Prostate cancer in his brother; Skin cancer in his sister. ROS:   Please see the history of present illness.    All 14 point review of systems negative except as described per history of present illness.  EKGs/Labs/Other Studies Reviewed:    The following studies were reviewed today:   EKG:       Recent Labs: No results found for requested labs within last 365 days.  Recent Lipid Panel No results found for: CHOL, TRIG, HDL, CHOLHDL, VLDL, LDLCALC, LDLDIRECT  Physical Exam:    VS:  BP 118/74 (BP Location: Left Arm, Patient Position: Sitting)   Pulse (!) 55   Ht 5' 10.5 (1.791 m)   Wt 221 lb (100.2 kg)   SpO2 96%   BMI 31.26 kg/m     Wt Readings from Last 3 Encounters:  08/26/23 221 lb (100.2 kg)  09/04/22 208 lb (94.3 kg)  04/25/21 208 lb 9.6 oz (94.6 kg)     GENERAL:  Well nourished, well developed in no acute distress NECK: No JVD; No carotid bruits CARDIAC: RRR, S1 and S2 present, no murmurs, no rubs, no gallops CHEST:  Clear to auscultation without rales, wheezing or rhonchi  Extremities: 1+ bilateral pitting pedal edema extending up to the knees.  Right foot with bruising and bluish discoloration of fifth toe. NEUROLOGIC:  Alert and  oriented x 3  Medication Adjustments/Labs and Tests Ordered: Current medicines are reviewed at length with the patient today.  Concerns regarding medicines are outlined above.  Orders Placed This Encounter  Procedures   Comprehensive metabolic panel with GFR   Pro b natriuretic peptide (BNP)   Magnesium   ECHOCARDIOGRAM COMPLETE   Meds ordered this encounter  Medications   furosemide  (LASIX ) 20 MG tablet    Sig: Take 1 tablet (20 mg total) by mouth daily. For 2 weeks then PRN weight gain of 2-3 lbs in 1 day or 5 lbs in 1 week    Dispense:  90 tablet    Refill:  3    Signed, Mckade Gurka reddy Yarima Penman, MD, MPH, [facility]. 08/26/2023 9:03 AM    [facility]

## 2023-08-26 NOTE — Assessment & Plan Note (Deleted)
 Blood pressure well-controlled. Continue amlodipine 5 mg once daily and irbesartan 300 mg once daily.  In the setting of longstanding history of hypertension and prior history of CAD now with pedal edema, would evaluate cardiac structure and function once again with a repeat echocardiogram.

## 2023-08-27 LAB — COMPREHENSIVE METABOLIC PANEL WITH GFR
ALT: 18 IU/L (ref 0–44)
AST: 15 IU/L (ref 0–40)
Albumin: 4 g/dL (ref 3.8–4.8)
Alkaline Phosphatase: 83 IU/L (ref 44–121)
BUN/Creatinine Ratio: 19 (ref 10–24)
BUN: 18 mg/dL (ref 8–27)
Bilirubin Total: 0.6 mg/dL (ref 0.0–1.2)
CO2: 18 mmol/L — ABNORMAL LOW (ref 20–29)
Calcium: 9.1 mg/dL (ref 8.6–10.2)
Chloride: 107 mmol/L — ABNORMAL HIGH (ref 96–106)
Creatinine, Ser: 0.97 mg/dL (ref 0.76–1.27)
Globulin, Total: 1.9 g/dL (ref 1.5–4.5)
Glucose: 109 mg/dL — ABNORMAL HIGH (ref 70–99)
Potassium: 4.8 mmol/L (ref 3.5–5.2)
Sodium: 140 mmol/L (ref 134–144)
Total Protein: 5.9 g/dL — ABNORMAL LOW (ref 6.0–8.5)
eGFR: 79 mL/min/1.73 (ref >59)

## 2023-08-27 LAB — MAGNESIUM: Magnesium: 2.1 mg/dL (ref 1.6–2.3)

## 2023-08-27 LAB — PRO B NATRIURETIC PEPTIDE: NT-Pro BNP: 118 pg/mL (ref 0–486)

## 2023-09-16 ENCOUNTER — Ambulatory Visit

## 2023-09-23 ENCOUNTER — Ambulatory Visit

## 2023-09-23 DIAGNOSIS — I119 Hypertensive heart disease without heart failure: Secondary | ICD-10-CM

## 2023-09-23 DIAGNOSIS — R600 Localized edema: Secondary | ICD-10-CM | POA: Diagnosis not present

## 2023-09-24 ENCOUNTER — Ambulatory Visit: Payer: Self-pay

## 2023-09-24 LAB — ECHOCARDIOGRAM COMPLETE
AR max vel: 3.05 cm2
AV Area VTI: 3.21 cm2
AV Area mean vel: 2.95 cm2
AV Mean grad: 3 mmHg
AV Peak grad: 6.4 mmHg
Ao pk vel: 1.26 m/s
Area-P 1/2: 2.87 cm2
MV VTI: 1.57 cm2
S' Lateral: 2.5 cm

## 2023-10-18 DIAGNOSIS — S20211A Contusion of right front wall of thorax, initial encounter: Secondary | ICD-10-CM | POA: Insufficient documentation

## 2023-11-24 DIAGNOSIS — I10 Essential (primary) hypertension: Secondary | ICD-10-CM | POA: Insufficient documentation

## 2023-11-24 DIAGNOSIS — E785 Hyperlipidemia, unspecified: Secondary | ICD-10-CM | POA: Insufficient documentation

## 2023-11-26 NOTE — Progress Notes (Signed)
 Cardiology Office Note:    Date:  11/26/2023   ID:  Billy Gallegos, DOB 25-Aug-1940, MRN [id number]  PCP:  Ofilia Lamar CROME, MD  Cardiologist:  Redell Leiter, MD    Referring MD: Ofilia Lamar CROME, MD    ASSESSMENT:    1. Coronary artery disease involving native coronary artery of native heart without angina pectoris   2. Hypertensive heart disease without heart failure   3. Mixed hyperlipidemia   4. Ascending aorta enlargement    PLAN:    In order of problems listed above:  Bardia continues to do well with CAD [place]  Heart Association class I no angina on good medical therapy will continue aspirin is high intensity statin plus Zetia lipids are ideal and antihypertensives. Well-controlled on current treatment calcium channel blocker ARB Continue his current regimen lipids are ideal I am not too concerned in his age group regarding mild enlargement of ascending aorta but I am concerned about a aneurysm and duplex is ordered   Next appointment: 1 year   Medication Adjustments/Labs and Tests Ordered: Current medicines are reviewed at length with the patient today.  Concerns regarding medicines are outlined above.  No orders of the defined types were placed in this encounter.  No orders of the defined types were placed in this encounter.    History of Present Illness:    Billy Gallegos is a 83 y.o. male with a hx of CAD with CABG 2008 type 2 diabetes hypertension and hyperlipidemia last seen 09/04/2022.  He also found on echocardiogram to have mild enlargement of the ascending aorta 41 mm is not uncommon in his age group.  Compliance with diet, lifestyle and medications: Yes  Seems like last visit with my partner the focus was weight loss up always every day he cannot lose weight he is frustrated I told him that things like semaglutide are an option but he is at that the comorbidities he tells me he has obstructive sleep apnea on CPAP we will send a referral to our Pharm.D.  weight loss program He is concerned about enlargement his ascending aorta I told him his age group at 20 it is very common to have mild enlargement but be more concerning if he had abdominal aortic aneurysm then he tells me it is in [place]  he was told he had a large in his abdominal aorta and requested duplex Ethon is not having angina edema shortness of breath palpitation or syncope Still does some carpentry work on the side Past Medical History:  Diagnosis Date   Acute bacterial bronchitis 12/25/2022   12/25/2022     Acute laryngotracheitis 04/23/2017   2019  07/09/2021     Allergic rhinitis 12/04/2015   Amaurosis fugax 05/20/2015   Formatting of this note might be different from the original. 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 2019   Community acquired pneumonia 07/14/2021   07/14/2021 : BLL, atypical     Coronary artery disease involving native coronary artery of native heart without angina pectoris 05/20/2015   Formatting of this note might be different from the original. 2006: 5V CABG 2012: Stress ECHO neg   Cough variant asthma 02/08/2023   2024: onset  post viral resp infection     COVID-19 virus infection 12/18/2019   Formatting of this note might be different from the original. 10/2019: mild, no MCA   Dacryocystitis of right lacrimal sac 07/02/2022   07/02/2022 : right     Deviated nasal septum 12/04/2015   Enlarged prostate with lower urinary tract symptoms (LUTS) 05/20/2015   Fatigue 06/18/2017   Foreign body (FB) in soft tissue 11/01/2020   Formatting of this note might be different from the original. 11/01/2020: left middle finger, excised   Gastrointestinal hemorrhage 05/20/2015   Gout  05/20/2015   Gynecomastia, male 02/23/2016   Formatting of this note might be different from the original. 2016: onset 2017: SURG eval, MMG, bilateral   History of chronic kidney disease 05/20/2015   History of hydrocele 02/21/2016   Formatting of this note might be different from the original. 2016   Hospital discharge follow-up 09/01/2018   HTN (hypertension)    Hyperlipemia    Hyperlipidemia, mixed 05/20/2015   Hypertensive heart disease    Lower abdominal pain 01/15/2020   Formatting of this note might be different from the original. 01/15/2020   Lumbar strain 08/24/2017   Formatting of this note might be different from the original. 2019: right   Mixed hyperlipidemia 02/02/2019   Muscle strain 11/16/2017   Formatting of this note might be different from the original. 2019: pectorali minor, right   Nocturia 06/26/2016   Obesity (BMI 30-39.9) 01/16/2020   Obstructive sleep apnea 08/11/2016   Formatting of this note might be different from the original. 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 2013   Pain in joint, lower leg 02/21/2016   Formatting of this note might be different from the original. 2016   Pre-operative clearance 02/02/2019   Prediabetes 05/21/2015   Formatting of this note might be different from the original. 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 2005: PSG mild changes   Strain of groin, left, initial encounter 09/22/2022   09/22/2022     Subacute cough 02/03/2023   2024     Total bilirubin, elevated 12/03/2022   12/03/2022: chronic, US  ordered     Tubular adenoma of colon  06/30/2019   Formatting of this note might be different from the original. 2021 :colonoscopy   Type 2 diabetes mellitus without complication, without long-term current use of insulin (HCC) 05/21/2015   Formatting of this note might be different from the original. 2017: 127/5.7 12/19/2020: 94/6.6   Viral respiratory infection 08/25/2021   08/25/2021      Current Medications: No outpatient medications have been marked as taking for the 11/29/23 encounter (Appointment) with Monetta Redell PARAS, MD.      EKGs/Labs/Other Studies Reviewed:    The following studies were reviewed today:  EKG Interpretation Date/Time:  Monday November 29 2023 10:23:24 EDT Ventricular Rate:  62 PR Interval:  220 QRS Duration:  74 QT Interval:  396 QTC Calculation: 401 R Axis:   48  Text Interpretation: Sinus rhythm with 1st degree A-V block Otherwise normal ECG When compared with ECG of 04-Sep-2022 08:11, No significant  change was found Confirmed by Monetta Rogue ([id number]) on 11/29/2023 10:29:21 AM         Recent Labs: 08/26/2023: ALT 18; BUN 18; Creatinine, Ser 0.97; Magnesium 2.1; NT-Pro BNP 118; Potassium 4.8; Sodium 140  Recent Lipid Panel 07/23/2023 cholesterol 87 LDL 42  Physical Exam:    VS:  There were no vitals taken for this visit.    Wt Readings from Last 3 Encounters:  08/26/23 221 lb (100.2 kg)  09/04/22 208 lb (94.3 kg)  04/25/21 208 lb 9.6 oz (94.6 kg)     GEN:  Well nourished, well developed in no acute distress HEENT: Normal NECK: No JVD; No carotid bruits LYMPHATICS: No lymphadenopathy CARDIAC: RRR, no murmurs, rubs, gallops RESPIRATORY:  Clear to auscultation without rales, wheezing or rhonchi  ABDOMEN: Soft, non-tender, non-distended MUSCULOSKELETAL:  No edema; No deformity  SKIN: Warm and dry NEUROLOGIC:  Alert and oriented x 3 PSYCHIATRIC:  Normal affect    Signed, Rogue Monetta, MD  11/26/2023 10:29 AM    [facility]

## 2023-11-29 ENCOUNTER — Ambulatory Visit: Attending: Cardiology | Admitting: Cardiology

## 2023-11-29 VITALS — BP 124/58 | HR 62 | Ht 71.0 in | Wt 222.4 lb

## 2023-11-29 DIAGNOSIS — E782 Mixed hyperlipidemia: Secondary | ICD-10-CM

## 2023-11-29 DIAGNOSIS — I119 Hypertensive heart disease without heart failure: Secondary | ICD-10-CM | POA: Diagnosis not present

## 2023-11-29 DIAGNOSIS — I7789 Other specified disorders of arteries and arterioles: Secondary | ICD-10-CM

## 2023-11-29 DIAGNOSIS — I2510 Atherosclerotic heart disease of native coronary artery without angina pectoris: Secondary | ICD-10-CM

## 2023-11-29 NOTE — Patient Instructions (Signed)
 Medication Instructions:  Your physician recommends that you continue on your current medications as directed. Please refer to the Current Medication list given to you today.  *If you need a refill on your cardiac medications before your next appointment, please call your pharmacy*  Lab Work: None If you have labs (blood work) drawn today and your tests are completely normal, you will receive your results only by: MyChart Message (if you have MyChart) OR A paper copy in the mail If you have any lab test that is abnormal or we need to change your treatment, we will call you to review the results.  Testing/Procedures: Your physician has requested that you have an abdominal aorta duplex. During this test, an ultrasound is used to evaluate the aorta. Allow 30 minutes for this exam. Do not eat after midnight the day before and avoid carbonated beverages.  Please note: We ask at that you not bring children with you during ultrasound (echo/ vascular) testing. Due to room size and safety concerns, children are not allowed in the ultrasound rooms during exams. Our front office staff cannot provide observation of children in our lobby area while testing is being conducted. An adult accompanying a patient to their appointment will only be allowed in the ultrasound room at the discretion of the ultrasound technician under special circumstances. We apologize for any inconvenience.   Follow-Up: At [facility], you and your health needs are our priority.  As part of our continuing mission to provide you with exceptional heart care, our providers are all part of one team.  This team includes your primary Cardiologist (physician) and Advanced Practice Providers or APPs (Physician Assistants and Nurse Practitioners) who all work together to provide you with the care you need, when you need it.  Your next appointment:   1 year(s)  Provider:   Redell Leiter, MD    We recommend signing up for the  patient portal called MyChart.  Sign up information is provided on this After Visit Summary.  MyChart is used to connect with patients for Virtual Visits (Telemedicine).  Patients are able to view lab/test results, encounter notes, upcoming appointments, etc.  Non-urgent messages can be sent to your provider as well.   To learn more about what you can do with MyChart, go to [email or web address].   Other Instructions None

## 2023-12-21 ENCOUNTER — Encounter: Payer: Self-pay | Admitting: Cardiology

## 2023-12-21 ENCOUNTER — Ambulatory Visit: Attending: Cardiology

## 2023-12-21 DIAGNOSIS — I119 Hypertensive heart disease without heart failure: Secondary | ICD-10-CM

## 2023-12-21 DIAGNOSIS — I2510 Atherosclerotic heart disease of native coronary artery without angina pectoris: Secondary | ICD-10-CM | POA: Diagnosis not present

## 2023-12-21 DIAGNOSIS — E782 Mixed hyperlipidemia: Secondary | ICD-10-CM | POA: Diagnosis not present

## 2023-12-21 DIAGNOSIS — I7789 Other specified disorders of arteries and arterioles: Secondary | ICD-10-CM

## 2024-01-25 ENCOUNTER — Ambulatory Visit: Attending: Cardiology | Admitting: Pharmacist Clinician (PhC)/ Clinical Pharmacy Specialist

## 2024-01-25 ENCOUNTER — Encounter: Payer: Self-pay | Admitting: Pharmacist Clinician (PhC)/ Clinical Pharmacy Specialist

## 2024-01-25 VITALS — Ht 70.0 in | Wt 226.2 lb

## 2024-01-25 DIAGNOSIS — E669 Obesity, unspecified: Secondary | ICD-10-CM

## 2024-01-25 NOTE — Patient Instructions (Addendum)
 We will start the prior authorization process to get Zepbound covered by your insurance.   If you can get a copy of the original sleep study, please send it to me via MyChart  TIPS FOR SUCCESS Write down the reasons why you want to lose weight and post it in a place where you'll see it often. Start small and work your way up. Keep in mind that it takes time to achieve goals, and small steps add up. Any additional movements help to burn calories. Taking the stairs rather than the elevator and parking at the far end of your parking lot are easy ways to start. Brisk walking for at least 30 minutes 4 or more days of the week is an excellent goal to work toward  [employer] WHAT IT MEANS TO FEEL FULL Did you know that it can take 15 minutes or more for your brain to receive the message that you've eaten? That means that, if you eat less food, but consume it slower, you may still feel satisfied. Eating a lot of fruits and vegetables can also help you feel fuller. Eat off of smaller plates so that moderate portions don't seem too small  TITRATION PLAN Will plan to follow the titration plan as below, pending patient is tolerating each dose before increasing to the next. Can slow titration if needed for tolerability.   If you have any questions or concerns, please reach out to us .  Darrel Gloss/Chris at [phone].  THANK YOU FOR CHOOSING [facility]

## 2024-01-25 NOTE — Assessment & Plan Note (Signed)
  Patient has not met goal of at least 5% of body weight loss with comprehensive lifestyle modifications alone in the past 3-6 months. Pharmacotherapy is appropriate to pursue as augmentation. Will start Zepbound.   Confirmed patient has no personal or family history of medullary thyroid carcinoma (MTC) or Multiple Endocrine Neoplasia syndrome type 2 (MEN 2).   Advised patient on common side effects including nausea, diarrhea, dyspepsia, decreased appetite, and fatigue. Counseled patient on reducing meal size and how to titrate medication to minimize side effects. Patient aware to call if intolerable side effects or if experiencing dehydration, abdominal pain, or dizziness. Patient will adhere to dietary modifications and will target at least 150 minutes of moderate intensity exercise weekly, plus resistance training twice a week (as recommended by the American Heart Association). This resistance training--such as weightlifting, bodyweight exercises, or using resistance bands, adapted to the patient's ability--will help prevent muscle loss.  Injection technique reviewed at today's visit.    Follow up in 1-2 days regarding coverage of Zepbound . If therapy is initiated, phone or MyChart follow-ups will be conducted every 4 weeks for dose titration until the patient reaches the effective therapeutic dose and target weight.

## 2024-01-25 NOTE — Progress Notes (Signed)
 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 male patient of Dr Leiter, in the office today to discuss options for weight management.    He currently uses CPAP each night, had a sleep study done with Prentice Graven PA at [facility], about 8 years ago.  He is going to see if he can find that documentation with [facility] and send it to us .  Will check the CareEverywhere to see if it is visible there.   Current weight management medications: none  Current meds that may affect weight: none  Baseline weight/BMI: 102.6 kg // 32.46  Insurance payor:  [employer] [id number]  Diet: eats out maybe twice per week; out is more sit down restaurants; home - plenty of venison and chicken; breakfast 3 eggs and sausage about 3-4 times per week; garden fresh vegetables - green beans (canned by wife), broccoli, cabbage, cauliflower, asparagus  Exercise: around property, chops and stacks wood, building decks; has gym membership but sporadic use   Confirmed patient has no personal or family history of medullary thyroid carcinoma (MTC) or Multiple Endocrine Neoplasia syndrome type 2 (MEN 2).   Social History:   Tobacco: no  Alcohol: occasional beer (7 oz), 12 pack lasts about 2 year   Accessory Clinical Findings    Lab Results  Component Value Date   CREATININE 0.97 08/26/2023   BUN 18 08/26/2023   NA 140 08/26/2023   K 4.8 08/26/2023   CL 107 (H) 08/26/2023   CO2 18 (L) 08/26/2023   Lab Results  Component Value Date   ALT 18 08/26/2023   AST 15 08/26/2023   ALKPHOS 83 08/26/2023    BILITOT 0.6 08/26/2023   No results found for: HGBA1C    Home Medications/Allergies    Current Outpatient Medications  Medication Sig Dispense Refill   allopurinol (ZYLOPRIM) 100 MG tablet Take 200 mg by mouth daily.     amLODipine (NORVASC) 5 MG tablet Take 5 mg by mouth daily.     aspirin 81 MG EC tablet Take 81 mg by mouth daily.     atorvastatin (LIPITOR) 80 MG tablet Take 80 mg by mouth daily.     colchicine 0.6 MG tablet Take 0.6 mg by mouth as needed (gout).     ezetimibe (ZETIA) 10 MG tablet Take 10 mg by mouth daily.     furosemide  (LASIX ) 20 MG tablet Take 1 tablet (20 mg total) by mouth daily. For 2 weeks then PRN weight gain of 2-3 lbs in 1 day or 5 lbs in 1 week 90 tablet 3   irbesartan (AVAPRO) 300 MG tablet Take 300 mg by mouth every morning.     tamsulosin (FLOMAX) 0.4 MG CAPS capsule Take 0.4 mg by mouth daily.     No current facility-administered medications for this visit.     Allergies[2]  Assessment & Plan    Obesity (BMI 30-39.9)  Patient has not met goal of at least 5% of body weight loss with comprehensive lifestyle  modifications alone in the past 3-6 months. Pharmacotherapy is appropriate to pursue as augmentation. Will start Zepbound.   Confirmed patient has no personal or family history of medullary thyroid carcinoma (MTC) or Multiple Endocrine Neoplasia syndrome type 2 (MEN 2).   Advised patient on common side effects including nausea, diarrhea, dyspepsia, decreased appetite, and fatigue. Counseled patient on reducing meal size and how to titrate medication to minimize side effects. Patient aware to call if intolerable side effects or if experiencing dehydration, abdominal pain, or dizziness. Patient will adhere to dietary modifications and will target at least 150 minutes of moderate intensity exercise weekly, plus resistance training twice a week (as recommended by the American Heart Association). This resistance training--such as weightlifting,  bodyweight exercises, or using resistance bands, adapted to the patient's ability--will help prevent muscle loss.  Injection technique reviewed at today's visit.    Follow up in 1-2 days regarding coverage of Zepbound . If therapy is initiated, phone or MyChart follow-ups will be conducted every 4 weeks for dose titration until the patient reaches the effective therapeutic dose and target weight.   Allean Mink PharmD CPP [facility] [address] [place], [place] [address] [phone]      [1]  Allergies Allergen Reactions   Oseltamivir Hives   Penicillin G Hives   Lisinopril Other (See Comments) and Cough    Cough (ALLERGY/intolerance)     Metoprolol Tartrate Other (See Comments)    bradycardia   Niacin Other (See Comments)    syncope    Alfuzosin Other (See Comments)    Worsening stream  [2]  Allergies Allergen Reactions   Oseltamivir Hives   Penicillin G Hives   Lisinopril Other (See Comments) and Cough    Cough (ALLERGY/intolerance)     Metoprolol Tartrate Other (See Comments)    bradycardia   Niacin Other (See Comments)    syncope    Alfuzosin Other (See Comments)    Worsening stream   "
